# Patient Record
Sex: Male | Born: 1995 | Race: Black or African American | Hispanic: No | Marital: Married | Smoking: Current every day smoker
Health system: Southern US, Community
[De-identification: ages and names within clinical notes are randomized; demographics above are authoritative.]

## PROBLEM LIST (undated history)

## (undated) ENCOUNTER — Emergency Department (HOSPITAL_BASED_OUTPATIENT_CLINIC_OR_DEPARTMENT_OTHER): Admission: EM | Payer: BC Managed Care – PPO | Source: Home / Self Care

## (undated) DIAGNOSIS — F419 Anxiety disorder, unspecified: Secondary | ICD-10-CM

## (undated) DIAGNOSIS — K219 Gastro-esophageal reflux disease without esophagitis: Secondary | ICD-10-CM

## (undated) DIAGNOSIS — F909 Attention-deficit hyperactivity disorder, unspecified type: Secondary | ICD-10-CM

## (undated) DIAGNOSIS — J45909 Unspecified asthma, uncomplicated: Secondary | ICD-10-CM

## (undated) HISTORY — DX: Anxiety disorder, unspecified: F41.9

## (undated) HISTORY — DX: Unspecified asthma, uncomplicated: J45.909

## (undated) HISTORY — DX: Gastro-esophageal reflux disease without esophagitis: K21.9

## (undated) HISTORY — DX: Attention-deficit hyperactivity disorder, unspecified type: F90.9

---
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
## 2013-01-19 ENCOUNTER — Ambulatory Visit: Payer: BC Managed Care – PPO | Admitting: Pediatrics

## 2013-01-31 ENCOUNTER — Encounter: Payer: Self-pay | Admitting: Pediatrics

## 2013-08-25 NOTE — Telephone Encounter (Signed)
done

## 2013-11-07 ENCOUNTER — Encounter: Payer: Self-pay | Admitting: Pediatrics

## 2013-11-07 ENCOUNTER — Ambulatory Visit (INDEPENDENT_AMBULATORY_CARE_PROVIDER_SITE_OTHER): Payer: BC Managed Care – PPO | Admitting: Pediatrics

## 2013-11-07 VITALS — BP 118/64 | Wt 195.5 lb

## 2013-11-07 DIAGNOSIS — S335XXA Sprain of ligaments of lumbar spine, initial encounter: Secondary | ICD-10-CM

## 2013-11-07 DIAGNOSIS — Z23 Encounter for immunization: Secondary | ICD-10-CM

## 2013-11-07 DIAGNOSIS — S39012A Strain of muscle, fascia and tendon of lower back, initial encounter: Secondary | ICD-10-CM

## 2013-11-07 NOTE — Patient Instructions (Addendum)
Please continue to take ibuprofen/Advil as needed for pain.  If you are needing this medicine for more than 2-3 more weeks, or if you start to get stomach pain symptoms, you should start to take an acid blocker medication. These are available over the counter, examples are Pepcid (famotidine) or Zantac (ranitidine).  You have been referred to a Sports Medicine physician. Please follow up with them.  You have a well check scheduled for follow up on 8/27.Back Exercises These exercises may help you when beginning to rehabilitate your injury. Your symptoms may resolve with or without further involvement from your physician, physical therapist or athletic trainer. While completing these exercises, remember:   Restoring tissue flexibility helps normal motion to return to the joints. This allows healthier, less painful movement and activity.  An effective stretch should be held for at least 30 seconds.  A stretch should never be painful. You should only feel a gentle lengthening or release in the stretched tissue. STRETCH - Extension, Prone on Elbows   Lie on your stomach on the floor, a bed will be too soft. Place your palms about shoulder width apart and at the height of your head.  Place your elbows under your shoulders. If this is too painful, stack pillows under your chest.  Allow your body to relax so that your hips drop lower and make contact more completely with the floor.  Hold this position for __________ seconds.  Slowly return to lying flat on the floor. Repeat __________ times. Complete this exercise __________ times per day.  RANGE OF MOTION - Extension, Prone Press Ups   Lie on your stomach on the floor, a bed will be too soft. Place your palms about shoulder width apart and at the height of your head.  Keeping your back as relaxed as possible, slowly straighten your elbows while keeping your hips on the floor. You may adjust the placement of your hands to maximize your comfort.  As you gain motion, your hands will come more underneath your shoulders.  Hold this position __________ seconds.  Slowly return to lying flat on the floor. Repeat __________ times. Complete this exercise __________ times per day.  RANGE OF MOTION- Quadruped, Neutral Spine   Assume a hands and knees position on a firm surface. Keep your hands under your shoulders and your knees under your hips. You may place padding under your knees for comfort.  Drop your head and point your tail bone toward the ground below you. This will round out your low back like an angry cat. Hold this position for __________ seconds.  Slowly lift your head and release your tail bone so that your back sags into a large arch, like an old horse.  Hold this position for __________ seconds.  Repeat this until you feel limber in your low back.  Now, find your "sweet spot." This will be the most comfortable position somewhere between the two previous positions. This is your neutral spine. Once you have found this position, tense your stomach muscles to support your low back.  Hold this position for __________ seconds. Repeat __________ times. Complete this exercise __________ times per day.  STRETCH - Flexion, Single Knee to Chest   Lie on a firm bed or floor with both legs extended in front of you.  Keeping one leg in contact with the floor, bring your opposite knee to your chest. Hold your leg in place by either grabbing behind your thigh or at your knee.  Pull until you  feel a gentle stretch in your low back. Hold __________ seconds.  Slowly release your grasp and repeat the exercise with the opposite side. Repeat __________ times. Complete this exercise __________ times per day.  STRETCH - Hamstrings, Standing  Stand or sit and extend your right / left leg, placing your foot on a chair or foot stool  Keeping a slight arch in your low back and your hips straight forward.  Lead with your chest and lean forward  at the waist until you feel a gentle stretch in the back of your right / left knee or thigh. (When done correctly, this exercise requires leaning only a small distance.)  Hold this position for __________ seconds. Repeat __________ times. Complete this stretch __________ times per day. STRENGTHENING - Deep Abdominals, Pelvic Tilt   Lie on a firm bed or floor. Keeping your legs in front of you, bend your knees so they are both pointed toward the ceiling and your feet are flat on the floor.  Tense your lower abdominal muscles to press your low back into the floor. This motion will rotate your pelvis so that your tail bone is scooping upwards rather than pointing at your feet or into the floor.  With a gentle tension and even breathing, hold this position for __________ seconds. Repeat __________ times. Complete this exercise __________ times per day.  STRENGTHENING - Abdominals, Crunches   Lie on a firm bed or floor. Keeping your legs in front of you, bend your knees so they are both pointed toward the ceiling and your feet are flat on the floor. Cross your arms over your chest.  Slightly tip your chin down without bending your neck.  Tense your abdominals and slowly lift your trunk high enough to just clear your shoulder blades. Lifting higher can put excessive stress on the low back and does not further strengthen your abdominal muscles.  Control your return to the starting position. Repeat __________ times. Complete this exercise __________ times per day.  STRENGTHENING - Quadruped, Opposite UE/LE Lift   Assume a hands and knees position on a firm surface. Keep your hands under your shoulders and your knees under your hips. You may place padding under your knees for comfort.  Find your neutral spine and gently tense your abdominal muscles so that you can maintain this position. Your shoulders and hips should form a rectangle that is parallel with the floor and is not twisted.  Keeping  your trunk steady, lift your right hand no higher than your shoulder and then your left leg no higher than your hip. Make sure you are not holding your breath. Hold this position __________ seconds.  Continuing to keep your abdominal muscles tense and your back steady, slowly return to your starting position. Repeat with the opposite arm and leg. Repeat __________ times. Complete this exercise __________ times per day. Document Released: 04/04/2005 Document Revised: 06/09/2011 Document Reviewed: 06/29/2008 [facility] Patient Information 2015 [place], [place]. This information is not intended to replace advice given to you by your health care provider. Make sure you discuss any questions you have with your health care provider.

## 2013-11-07 NOTE — Progress Notes (Signed)
Subjective:    Torell is a 18 y.o. old male here with his mother for Back Pain .    HPI Comments: The patient works at [employer], and he was loading boxes, when he slipped and hit his back on the metal slide where the boxes slide. He says he hit in the small of his back. This occurred 10/25/13. He was sent home that day. The next day he did not feel better but went back to work and tried to work, but has been sent home 3 days and worked 4 complete days since then. He does have days when he feels somewhat better, but no pain-free days since the injury. He has been taking advil 600mg  in the morning and evening every day since it happened, this helps some. He took a dose of his mother's tizanidine on 3 occasions at night, which made him fall asleep but did not have any lasting benefit on his back pain. He has also used ice, heating pads and icy hot with some improvement.  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 Then he goes to [employer] at night from 5-10pm. Summer camp is ending this week.  He wears a brace at work.   Review of Systems  History and Problem List: Gardner has Overweight; Knee pain, chronic; Loss of weight; Other malaise and fatigue; and Nausea alone on his problem list.  Yash  has a past medical history of ADHD (attention deficit hyperactivity disorder) (4.16.03 ) and Asthma (11.16.10).  Immunizations needed: HPV (given)     Objective:    BP 118/64  Wt 195 lb 8.8 oz (88.7 kg)  Physical Exam  Nursing note and vitals reviewed. Constitutional: He is oriented to person, place, and time. He appears well-nourished. No distress.  HENT:  Head: Normocephalic and atraumatic.  Right Ear: External ear normal.  Left Ear: External ear normal.  Nose: Nose normal.  Eyes: Conjunctivae and EOM are normal. Right eye exhibits no discharge. Left eye exhibits no discharge.  Neck: Normal range of motion.  Cardiovascular: Normal rate and regular rhythm.   Pulmonary/Chest: Effort  normal. No respiratory distress.  Musculoskeletal: He exhibits tenderness.  Diffusely tender over spine and paraspinous area from L3 down to the sacrum. No specific point tenderness. Significant muscular tightness. Straight leg raise negative bilaterally. Pt able to flex fully to touch toes, although slowly due to pain. No visible bruising, no cords or palpable point abnormalities.  Neurological: He is alert and oriented to person, place, and time.  Skin: Skin is warm and dry.  Psychiatric: He has a normal mood and affect. His behavior is normal.       Assessment and Plan:     Myrtle was seen today for Back Pain  His exam is reassuring that there is not an underlying bony injury or disc herniation, specifically he does not have sharp point tenderness over the spine, nor does he have radicular symptoms such as weakness, numbness or tingling in the legs. His injury is a bit concerning in that I would have expected it to have healed more by now. However, he has not been able to rest his back much since the injury given his work schedule. Therefore, I would like him to be seen by sports medicine in order to provide more help with relaxing the involved muscles and resolving the underlying inflammation. Ultimately what he most likely needs is rest, but given his social circumstances and the job at [employer], he is unlikely  to be able to rest fully without jeopardizing his job. I have offered him a doctor's note that could help with his work if he wishes; he will call in the near future if this becomes an issue.  I also recommended that he continue to use ibuprofen for back pain and gave some instructions for stretches/exercises that he can do to loosen his back muscles. I did not start him on any GI prophylaxis today, but recommended that he start an acid blocker if he needs the ibuprofen for more than an additional 2-3 weeks, or if he develops any stomach/abdominal pain.  He is due for a well check and we  have scheduled this with his PCP; thereafter, he plans to change to an adult provider.   Problem List Items Addressed This Visit   None    Visit Diagnoses   Low back strain, initial encounter    -  Primary    Relevant Orders       Ambulatory referral to Pediatric Orthopedics    Need for prophylactic vaccination and inoculation against unspecified single disease        Relevant Orders       HPV vaccine quadravalent 3 dose IM (Completed)       Return in 2 weeks (on 11/24/2013) for well check.  Ansel BongNidel, Kahdijah Errickson, MD

## 2013-11-09 NOTE — Progress Notes (Signed)
I saw and examined the patient with the resident physician in clinic and agree with the above documentation. Aubria Vanecek, MD 

## 2013-11-24 ENCOUNTER — Ambulatory Visit: Payer: Self-pay | Admitting: Pediatrics

## 2015-09-11 ENCOUNTER — Encounter (HOSPITAL_COMMUNITY): Payer: Self-pay

## 2015-09-11 ENCOUNTER — Emergency Department (HOSPITAL_COMMUNITY)
Admission: EM | Admit: 2015-09-11 | Discharge: 2015-09-11 | Disposition: A | Discharge status: Home / Self Care | Payer: BLUE CROSS/BLUE SHIELD | Attending: Emergency Medicine | Admitting: Emergency Medicine

## 2015-09-11 DIAGNOSIS — J45909 Unspecified asthma, uncomplicated: Secondary | ICD-10-CM | POA: Diagnosis not present

## 2015-09-11 DIAGNOSIS — F17210 Nicotine dependence, cigarettes, uncomplicated: Secondary | ICD-10-CM | POA: Diagnosis not present

## 2015-09-11 DIAGNOSIS — F1290 Cannabis use, unspecified, uncomplicated: Secondary | ICD-10-CM | POA: Insufficient documentation

## 2015-09-11 DIAGNOSIS — R55 Syncope and collapse: Secondary | ICD-10-CM

## 2015-09-11 DIAGNOSIS — F909 Attention-deficit hyperactivity disorder, unspecified type: Secondary | ICD-10-CM | POA: Diagnosis not present

## 2015-09-11 LAB — CBC
HEMATOCRIT: 42.6 % (ref 39.0–52.0)
HEMOGLOBIN: 15.3 g/dL (ref 13.0–17.0)
MCH: 34.1 pg — ABNORMAL HIGH (ref 26.0–34.0)
MCHC: 35.9 g/dL (ref 30.0–36.0)
MCV: 94.9 fL (ref 78.0–100.0)
Platelets: 153 10*3/uL (ref 150–400)
RBC: 4.49 MIL/uL (ref 4.22–5.81)
RDW: 12.1 % (ref 11.5–15.5)
WBC: 3.4 10*3/uL — AB (ref 4.0–10.5)

## 2015-09-11 LAB — BASIC METABOLIC PANEL
Anion gap: 7 (ref 5–15)
BUN: 15 mg/dL (ref 6–20)
CHLORIDE: 105 mmol/L (ref 101–111)
CO2: 26 mmol/L (ref 22–32)
CREATININE: 0.89 mg/dL (ref 0.61–1.24)
Calcium: 9.2 mg/dL (ref 8.9–10.3)
GFR calc Af Amer: >60 mL/min (ref >60)
GFR calc non Af Amer: >60 mL/min (ref >60)
GLUCOSE: 81 mg/dL (ref 65–99)
POTASSIUM: 3.9 mmol/L (ref 3.5–5.1)
SODIUM: 138 mmol/L (ref 135–145)

## 2015-09-11 LAB — URINALYSIS, ROUTINE W REFLEX MICROSCOPIC
BILIRUBIN URINE: NEGATIVE
GLUCOSE, UA: NEGATIVE mg/dL
Hgb urine dipstick: NEGATIVE
KETONES UR: 15 mg/dL — AB
LEUKOCYTES UA: NEGATIVE
Nitrite: NEGATIVE
PH: 7 (ref 5.0–8.0)
Protein, ur: NEGATIVE mg/dL
SPECIFIC GRAVITY, URINE: 1.028 (ref 1.005–1.030)

## 2015-09-11 LAB — CBG MONITORING, ED: GLUCOSE-CAPILLARY: 87 mg/dL (ref 65–99)

## 2015-09-11 LAB — TROPONIN I: Troponin I: <0.03 ng/mL (ref <0.031)

## 2015-09-11 MED ORDER — OXYCODONE-ACETAMINOPHEN 5-325 MG PO TABS
1.0000 | ORAL_TABLET | Freq: Once | ORAL | Status: AC
  Administered 2015-09-11: 1 via ORAL
  Filled 2015-09-11: qty 1

## 2015-09-11 MED ORDER — LIDOCAINE 5 % EX PTCH: 1.0000 | MEDICATED_PATCH | CUTANEOUS | Status: DC

## 2015-09-11 MED ORDER — OXYCODONE-ACETAMINOPHEN 5-325 MG PO TABS: 1.0000 | ORAL_TABLET | ORAL | Status: DC | PRN

## 2015-09-11 MED ORDER — NAPROXEN 500 MG PO TABS: 500.0000 mg | ORAL_TABLET | Freq: Two times a day (BID) | ORAL | Status: DC

## 2015-09-11 MED ORDER — SODIUM CHLORIDE 0.9 % IV BOLUS (SEPSIS)
1000.0000 mL | Freq: Once | INTRAVENOUS | Status: AC
  Administered 2015-09-11: 1000 mL via INTRAVENOUS

## 2015-09-11 MED ORDER — IBUPROFEN 200 MG PO TABS
600.0000 mg | ORAL_TABLET | Freq: Once | ORAL | Status: AC
  Administered 2015-09-11: 600 mg via ORAL
  Filled 2015-09-11: qty 3

## 2015-09-11 MED ORDER — OMEPRAZOLE 20 MG PO CPDR
20.0000 mg | DELAYED_RELEASE_CAPSULE | Freq: Every day | ORAL | Status: DC
Start: 2015-09-11 — End: 2021-06-04

## 2015-09-11 NOTE — ED Notes (Signed)
Per EMS, Pt, from place of work, c/o low back and L shoulder pain after an unwitnessed syncopal episode/fall.  Pain score 7/10.  Pt reports that he has been under a lot off stress recently.  Pt reported chronic low back and L shoulder pain to EMS.  Denies headache.  No deformities noted.

## 2015-09-11 NOTE — ED Notes (Signed)
Bed: WA09 Expected date:  Expected time:  Means of arrival:  Comments: 20 y/o M syncope

## 2015-09-11 NOTE — ED Notes (Signed)
Pt reports previous injury to low back and shoulder while "working at [employer]."  Pt denies surgeries and denies taking pain medications.  Pt reports decreased appetite x "a while" d/t "not having time to eat, stress, and not being hungry."

## 2015-09-11 NOTE — Discharge Instructions (Signed)
You have been seen today for a syncopal episode. Your lab tests showed no acute abnormalities. Return to ED should symptoms recur.  1. Follow up with PCP as soon as possible for reevaluation and chronic management. 2. Drink plenty of fluids and get plenty of rest. You should be drinking at least a liter of water an hour to stay hydrated. Be sure to eat regular meals and snacks throughout the day. Get at least 7-8 hours of sleep each day, if at all possible. Ibuprofen or Tylenol for pain or fever. Zofran for nausea.  3. Prilosec: This medication reduces acid in your stomach. It should help you with your nausea and burning that you have after eating. Take this medication every morning regardless of how you feel. Take it 20-30 minutes prior to your first meal.

## 2015-09-11 NOTE — ED Provider Notes (Signed)
CSN: [id number]     Arrival date & time 09/11/15  0930 History   First MD Initiated Contact with Patient 09/11/15 1002     Chief Complaint  Patient presents with  . Loss of Consciousness  . Back Pain     (Consider location/radiation/quality/duration/timing/severity/associated sxs/prior Treatment) HPI   Juan Perkins is a 20 y.o. male, with a history of ADHD and asthma, presenting to the ED with a syncopal episode that occurred just prior to arrival. Patient states he was sitting in a chair, began to stand up, felt lightheaded, and then woke up on the floor. Coworker states she heard the patient fall and immediately came to his side. Patient was immediately conscious and alert. No seizure activity or confusion witnessed. Last food was around 8-10pm. Patient states he has been working 2 jobs, is under a great of stress, and does not eat or drink usually during a 14 hour shift. Patient states that in addition to not eating due to being busy, he also gets nauseous when he eats. Patient complains of right lower back and right shoulder pain, which is chronic, and feels no worse than normal. Patient denies nausea/vomiting, fever/chills, shortness of breath, chest pain, headache, or any other complaints.  Past Medical History  Diagnosis Date  . ADHD (attention deficit hyperactivity disorder) 4.16.03     dx from [facility] (now [facility] records; on Concerta, then Adderall XR to 4.08, then back to Concerta until 7.09  . Asthma 11.16.10    from [facility] records; one note with rx for albuterol and sample of  singulair    History reviewed. No pertinent past surgical history. Family History  Problem Relation Age of Onset  . Diabetes Paternal Grandfather    Social History  Substance Use Topics  . Smoking status: Current Every Day Smoker    Types: Cigarettes  . Smokeless tobacco: None  . Alcohol Use: Yes     Comment: occ    Review of Systems   Constitutional: Negative for fever and chills.  Respiratory: Negative for shortness of breath.   Cardiovascular: Negative for chest pain.  Gastrointestinal: Negative for nausea, vomiting and abdominal pain.  Musculoskeletal: Negative for neck pain.  Skin: Negative for color change and pallor.  Neurological: Positive for syncope. Negative for dizziness, weakness, light-headedness, numbness and headaches.  All other systems reviewed and are negative.     Allergies  Review of patient's allergies indicates no known allergies.  Home Medications   Prior to Admission medications   Medication Sig Start Date End Date Taking? Authorizing Provider  lidocaine (LIDODERM) 5 % Place 1 patch onto the skin daily. Remove & Discard patch within 12 hours or as directed by MD 09/11/15   Hillard DankerShawn C Joy, PA-C  naproxen (NAPROSYN) 500 MG tablet Take 1 tablet (500 mg total) by mouth 2 (two) times daily. 09/11/15   Shawn C Joy, PA-C  omeprazole (PRILOSEC) 20 MG capsule Take 1 capsule (20 mg total) by mouth daily. 09/11/15   Shawn C Joy, PA-C  oxyCODONE-acetaminophen (PERCOCET/ROXICET) 5-325 MG tablet Take 1 tablet by mouth every 4 (four) hours as needed for severe pain. 09/11/15   Shawn C Joy, PA-C   BP 130/83 mmHg  Pulse 61  Temp(Src) 97.9 F (36.6 C) (Oral)  Resp 13  SpO2 100% Physical Exam  Constitutional: He is oriented to person, place, and time. He appears well-developed and well-nourished. No distress.  HENT:  Head: Normocephalic and atraumatic.  Eyes: Conjunctivae and EOM  are normal. Pupils are equal, round, and reactive to light.  Neck: Normal range of motion. Neck supple.  Cardiovascular: Normal rate, regular rhythm, normal heart sounds and intact distal pulses.   Pulmonary/Chest: Effort normal and breath sounds normal. No respiratory distress.  Abdominal: Soft. There is no tenderness. There is no guarding.  Musculoskeletal: He exhibits no edema or tenderness.  Some tenderness to the musculature  of the right lower back. Full ROM in all extremities and spine. No paraspinal tenderness.   Lymphadenopathy:    He has no cervical adenopathy.  Neurological: He is alert and oriented to person, place, and time. He has normal reflexes.  No sensory deficits. Strength 5/5 in all extremities. No gait disturbance. Coordination intact. Cranial nerves III-XII grossly intact. No facial droop.   Skin: Skin is warm and dry. He is not diaphoretic.  Psychiatric: He has a normal mood and affect. His behavior is normal.  Nursing note and vitals reviewed.   ED Course  Procedures (including critical care time) Labs Review Labs Reviewed  CBC - Abnormal; Notable for the following:    WBC 3.4 (*)    MCH 34.1 (*)    All other components within normal limits  URINALYSIS, ROUTINE W REFLEX MICROSCOPIC (NOT AT [facility]) - Abnormal; Notable for the following:    Ketones, ur 15 (*)    All other components within normal limits  BASIC METABOLIC PANEL  TROPONIN I  CBG MONITORING, ED  CBG MONITORING, ED    Imaging Review No results found. I have personally reviewed and evaluated these images and lab results as part of my medical decision-making.   EKG Interpretation   Date/Time:  Tuesday September 11 2015 09:35:57 EDT Ventricular Rate:  56 PR Interval:  204 QRS Duration: 84 QT Interval:  405 QTC Calculation: 391 R Axis:   -26 Text Interpretation:  Sinus rhythm Borderline prolonged PR interval  Probable left ventricular hypertrophy ST elev, probable normal early repol  pattern No old tracing to compare Confirmed by [facility]  MD, ELLIOTT [phone])  on 09/11/2015 9:46:42 AM      Orthostatic VS for the past 24 hrs:  BP- Lying Pulse- Lying BP- Sitting Pulse- Sitting BP- Standing at 0 minutes Pulse- Standing at 0 minutes  09/11/15 1128 149/79 mmHg 51 135/84 mmHg 66 147/78 mmHg 71      MDM   Final diagnoses:  Syncope, unspecified syncope type    Juan Guess presents for evaluation following a  syncopal episode that occurred just prior to arrival.  Upon first attempted patient contact, patient is sitting upright talking on the phone in no apparent distress. Patient is moving his extremities as well as his head and neck. Patient did not get off the phone to allow evaluation and thus was evaluated at a later time. Patient has no neuro or functional deficits. Based on the physical exam findings combined with the patient's history, suspect he is hypovolemic or vasovagal syncope. Patient felt much better following a liter of IV fluids. Patient ambulated without difficulty. No acute abnormalities on the patient's labs. No indication for imaging at this time. Patient also told of some symptoms that would come on after he eats. Patient admits to a poor diet. States that when he eats certain foods, especially those high in fat, he feels nauseous and gets a burning in his chest. Patient advised on proper diet to alleviate these symptoms. Encouraged to drink plenty of fluids throughout the day. Needs to get more sleep and reduce stress.  Home care strategies and return precautions discussed. Patient to follow up with PCP. Patient voiced understanding of these instructions, accepts the plan, and is comfortable with discharge. Given prescriptions for his back pain as well as Prilosec for his GERD-like symptoms.  Filed Vitals:   09/11/15 1100 09/11/15 1126 09/11/15 1130 09/11/15 1300  BP: 147/79 147/79 135/84 132/80  Pulse: 67 69 66 64  Temp:      TempSrc:      Resp: 20 20 19 17   SpO2: 100% 100% 100% 100%      Anselm Pancoast, PA-C 09/11/15 1450  Mancel Bale, MD 09/11/15 1545

## 2016-05-07 ENCOUNTER — Telehealth: Payer: Self-pay | Admitting: Internal Medicine

## 2016-05-07 ENCOUNTER — Encounter: Payer: Self-pay | Admitting: Internal Medicine

## 2016-05-07 NOTE — Telephone Encounter (Signed)
Mother called recently and asked that we see patient fairly urgently because of a six-month history of dry mouth and urinating a lot. He would be a new patient to us. We made appointment for today with fasting lab work. Patient failed to keep the appointment. When I called to check with the patient, the phone number had been disconnected.  Note mailed to patient's mother regarding missed appointment.

## 2016-05-16 ENCOUNTER — Encounter: Payer: Self-pay | Admitting: Internal Medicine

## 2016-07-21 ENCOUNTER — Ambulatory Visit
Admission: RE | Admit: 2016-07-21 | Discharge: 2016-07-21 | Disposition: A | Discharge status: Home / Self Care | Payer: BLUE CROSS/BLUE SHIELD | Source: Ambulatory Visit | Attending: Internal Medicine | Admitting: Internal Medicine

## 2016-07-21 ENCOUNTER — Ambulatory Visit (INDEPENDENT_AMBULATORY_CARE_PROVIDER_SITE_OTHER): Payer: BLUE CROSS/BLUE SHIELD | Admitting: Internal Medicine

## 2016-07-21 ENCOUNTER — Encounter: Payer: Self-pay | Admitting: Internal Medicine

## 2016-07-21 VITALS — BP 120/90 | HR 65 | Temp 97.0°F | Ht 70.0 in | Wt 195.0 lb

## 2016-07-21 DIAGNOSIS — R110 Nausea: Secondary | ICD-10-CM | POA: Diagnosis not present

## 2016-07-21 DIAGNOSIS — Z0000 Encounter for general adult medical examination without abnormal findings: Secondary | ICD-10-CM

## 2016-07-21 DIAGNOSIS — Z8709 Personal history of other diseases of the respiratory system: Secondary | ICD-10-CM | POA: Diagnosis not present

## 2016-07-21 DIAGNOSIS — J3089 Other allergic rhinitis: Secondary | ICD-10-CM

## 2016-07-21 DIAGNOSIS — R05 Cough: Secondary | ICD-10-CM

## 2016-07-21 DIAGNOSIS — Z13220 Encounter for screening for lipoid disorders: Secondary | ICD-10-CM | POA: Diagnosis not present

## 2016-07-21 DIAGNOSIS — Z113 Encounter for screening for infections with a predominantly sexual mode of transmission: Secondary | ICD-10-CM | POA: Diagnosis not present

## 2016-07-21 DIAGNOSIS — M545 Low back pain: Secondary | ICD-10-CM

## 2016-07-21 DIAGNOSIS — G8929 Other chronic pain: Secondary | ICD-10-CM

## 2016-07-21 DIAGNOSIS — R634 Abnormal weight loss: Secondary | ICD-10-CM

## 2016-07-21 DIAGNOSIS — B360 Pityriasis versicolor: Secondary | ICD-10-CM

## 2016-07-21 DIAGNOSIS — R059 Cough, unspecified: Secondary | ICD-10-CM

## 2016-07-21 DIAGNOSIS — R202 Paresthesia of skin: Secondary | ICD-10-CM | POA: Diagnosis not present

## 2016-07-21 LAB — COMPREHENSIVE METABOLIC PANEL
ALK PHOS: 60 U/L (ref 40–115)
ALT: 47 U/L — ABNORMAL HIGH (ref 9–46)
AST: 39 U/L (ref 10–40)
Albumin: 4 g/dL (ref 3.6–5.1)
BILIRUBIN TOTAL: 0.5 mg/dL (ref 0.2–1.2)
BUN: 8 mg/dL (ref 7–25)
CO2: 29 mmol/L (ref 20–31)
CREATININE: 1.04 mg/dL (ref 0.60–1.35)
Calcium: 9.1 mg/dL (ref 8.6–10.3)
Chloride: 106 mmol/L (ref 98–110)
Glucose, Bld: 84 mg/dL (ref 65–99)
Potassium: 3.9 mmol/L (ref 3.5–5.3)
SODIUM: 141 mmol/L (ref 135–146)
TOTAL PROTEIN: 6.8 g/dL (ref 6.1–8.1)

## 2016-07-21 LAB — POCT URINALYSIS DIPSTICK
Bilirubin, UA: NEGATIVE
Blood, UA: NEGATIVE
GLUCOSE UA: NEGATIVE
Ketones, UA: NEGATIVE
Leukocytes, UA: NEGATIVE
NITRITE UA: NEGATIVE
Spec Grav, UA: 1.01 (ref 1.010–1.025)
UROBILINOGEN UA: 0.2 U/dL
pH, UA: 7 (ref 5.0–8.0)

## 2016-07-21 LAB — CBC WITH DIFFERENTIAL/PLATELET
Basophils Absolute: 30 cells/uL (ref 0–200)
Basophils Relative: 1 %
EOS ABS: 150 {cells}/uL (ref 15–500)
EOS PCT: 5 %
HCT: 44.4 % (ref 38.5–50.0)
Hemoglobin: 15.2 g/dL (ref 13.2–17.1)
Lymphocytes Relative: 45 %
Lymphs Abs: 1350 cells/uL (ref 850–3900)
MCH: 34.2 pg — ABNORMAL HIGH (ref 27.0–33.0)
MCHC: 34.2 g/dL (ref 32.0–36.0)
MCV: 100 fL (ref 80.0–100.0)
MONOS PCT: 8 %
MPV: 9.1 fL (ref 7.5–12.5)
Monocytes Absolute: 240 cells/uL (ref 200–950)
Neutro Abs: 1230 cells/uL — ABNORMAL LOW (ref 1500–7800)
Neutrophils Relative %: 41 %
PLATELETS: 144 10*3/uL (ref 140–400)
RBC: 4.44 MIL/uL (ref 4.20–5.80)
RDW: 13.2 % (ref 11.0–15.0)
WBC: 3 10*3/uL — ABNORMAL LOW (ref 3.8–10.8)

## 2016-07-21 LAB — LIPID PANEL
CHOLESTEROL: 133 mg/dL (ref <200)
HDL: 50 mg/dL (ref >40)
LDL Cholesterol: 72 mg/dL (ref <100)
Total CHOL/HDL Ratio: 2.7 Ratio (ref <5.0)
Triglycerides: 55 mg/dL (ref <150)
VLDL: 11 mg/dL (ref <30)

## 2016-07-21 LAB — TSH: TSH: 0.45 m[IU]/L (ref 0.40–4.50)

## 2016-07-21 MED ORDER — KETOCONAZOLE 2 % EX SHAM: 1.0000 "application " | MEDICATED_SHAMPOO | CUTANEOUS | 99 refills | Status: DC

## 2016-07-21 MED ORDER — ALBUTEROL SULFATE HFA 108 (90 BASE) MCG/ACT IN AERS: 2.0000 | INHALATION_SPRAY | Freq: Four times a day (QID) | RESPIRATORY_TRACT | 0 refills | Status: DC | PRN

## 2016-07-21 NOTE — Patient Instructions (Addendum)
Lab work drawn and pending. Try to get MRI of the LS spine for chronic persistent low back pain. Ventolin inhaler refilled. To have chest x-ray with history of weight loss and asthma. Follow-up in 4 weeks.
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
## 2016-07-22 LAB — HEMOGLOBIN A1C
Hgb A1c MFr Bld: 4.9 % (ref <5.7)
MEAN PLASMA GLUCOSE: 94 mg/dL

## 2016-07-22 LAB — GC/CHLAMYDIA PROBE AMP
CT PROBE, AMP APTIMA: NOT DETECTED
GC PROBE AMP APTIMA: NOT DETECTED

## 2016-07-22 LAB — VITAMIN B12: VITAMIN B 12: 364 pg/mL (ref 200–1100)

## 2016-07-22 LAB — H. PYLORI BREATH TEST: H. PYLORI BREATH TEST: NOT DETECTED

## 2016-07-22 LAB — FOLATE: FOLATE: 10.4 ng/mL (ref >5.4)

## 2016-07-25 ENCOUNTER — Other Ambulatory Visit: Payer: Self-pay | Admitting: Internal Medicine

## 2016-07-25 MED ORDER — MELOXICAM 15 MG PO TABS: 15.0000 mg | ORAL_TABLET | Freq: Every day | ORAL | 0 refills | Status: DC

## 2016-07-30 ENCOUNTER — Telehealth: Payer: Self-pay | Admitting: Internal Medicine

## 2016-07-30 NOTE — Telephone Encounter (Signed)
MRI LS Spine without contrast; spoke with [facility] @ [facility]; [phone]; Berkley Harvey #[id number] eff 07/30/16 - 09/27/16.  Will ask Dr. Lenord Fellers or Araceli to put MRI order in EPIC.  Then will call [facility] to schedule MRI.  Will then call and have this scheduled.

## 2016-07-31 ENCOUNTER — Other Ambulatory Visit: Payer: Self-pay | Admitting: Internal Medicine

## 2016-07-31 DIAGNOSIS — M5440 Lumbago with sciatica, unspecified side: Secondary | ICD-10-CM

## 2016-07-31 NOTE — Telephone Encounter (Signed)
MRI scheduled for 08/11/16 @ [facility] at 7:10 a.m.  Patient has been notified.

## 2016-08-11 ENCOUNTER — Other Ambulatory Visit: Payer: BLUE CROSS/BLUE SHIELD

## 2016-08-13 ENCOUNTER — Telehealth: Payer: Self-pay

## 2016-08-13 NOTE — Telephone Encounter (Signed)
Patient called to discuss lab work and ask what STD screenings where done. Let him know that only GC/Chlamyida was done and came back negative. Pt asked about any other tests that can be ran to look for possible STDS I informed him we do not like to do the HSV screening since it is not always conclusive but HIV screening can be done. Pt states he understood the information and had no other questions at this time.

## 2016-08-19 ENCOUNTER — Ambulatory Visit: Payer: BLUE CROSS/BLUE SHIELD | Admitting: Internal Medicine

## 2016-08-27 ENCOUNTER — Other Ambulatory Visit: Payer: Self-pay | Admitting: Internal Medicine

## 2016-10-29 ENCOUNTER — Other Ambulatory Visit: Payer: BLUE CROSS/BLUE SHIELD

## 2016-10-31 ENCOUNTER — Ambulatory Visit: Payer: BLUE CROSS/BLUE SHIELD | Admitting: Internal Medicine

## 2016-11-06 ENCOUNTER — Encounter: Payer: Self-pay | Admitting: Internal Medicine

## 2016-11-06 ENCOUNTER — Ambulatory Visit: Payer: BLUE CROSS/BLUE SHIELD | Admitting: Internal Medicine

## 2016-11-06 ENCOUNTER — Telehealth: Payer: Self-pay | Admitting: Internal Medicine

## 2016-11-06 NOTE — Telephone Encounter (Signed)
Patient had been scheduled previously for MRI of LS-spine and missed that appointment at [facility]. He failed to reschedule within the timeframe permitted by the insurance company- I.e. the order expired. We requested that he come back for another office visit. He missed a recent appointment for that. We called him and spoke with him and explained to him that this order had expired and he would need to be seen here again before we could put in another order. He seemed to understand that. Says his schedule did not permit him to go to the MRI appointment that was made for him. We made an appointment for today to discuss this with him. He did not come for the appointment.  I also spoke with his mother a few days ago about his failure to follow-up. Told her I was frustrated with this. We are not going to reschedule another appointment at this time until we hear from patient

## 2018-05-29 IMAGING — CR DG CHEST 2V
2 series · 2 of 2 positions shown · non-contrast
Comparison: None.

CLINICAL DATA: Persistent cough.  Asthma.

EXAM:
CHEST  2 VIEW

[w chest pa]
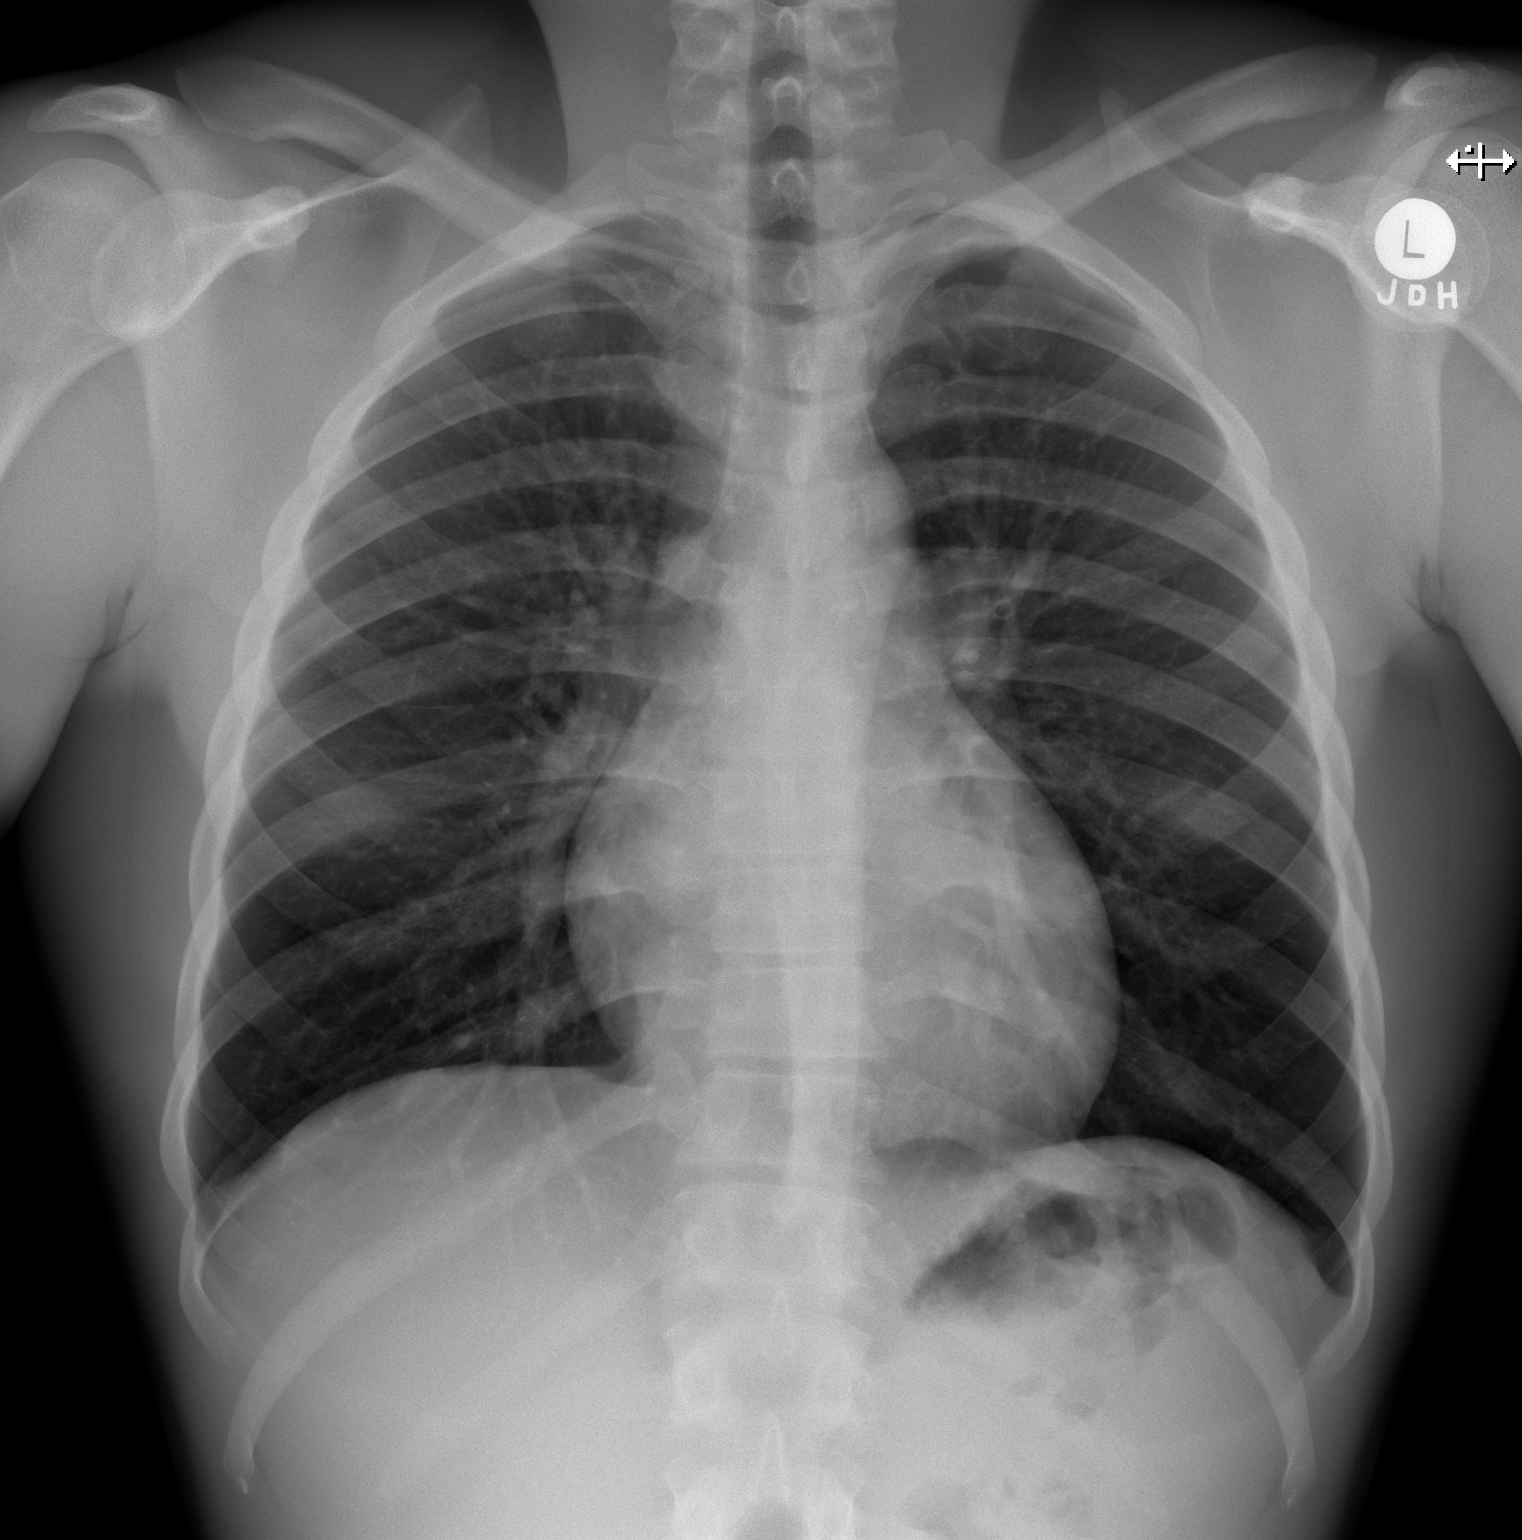

[w chest lat]
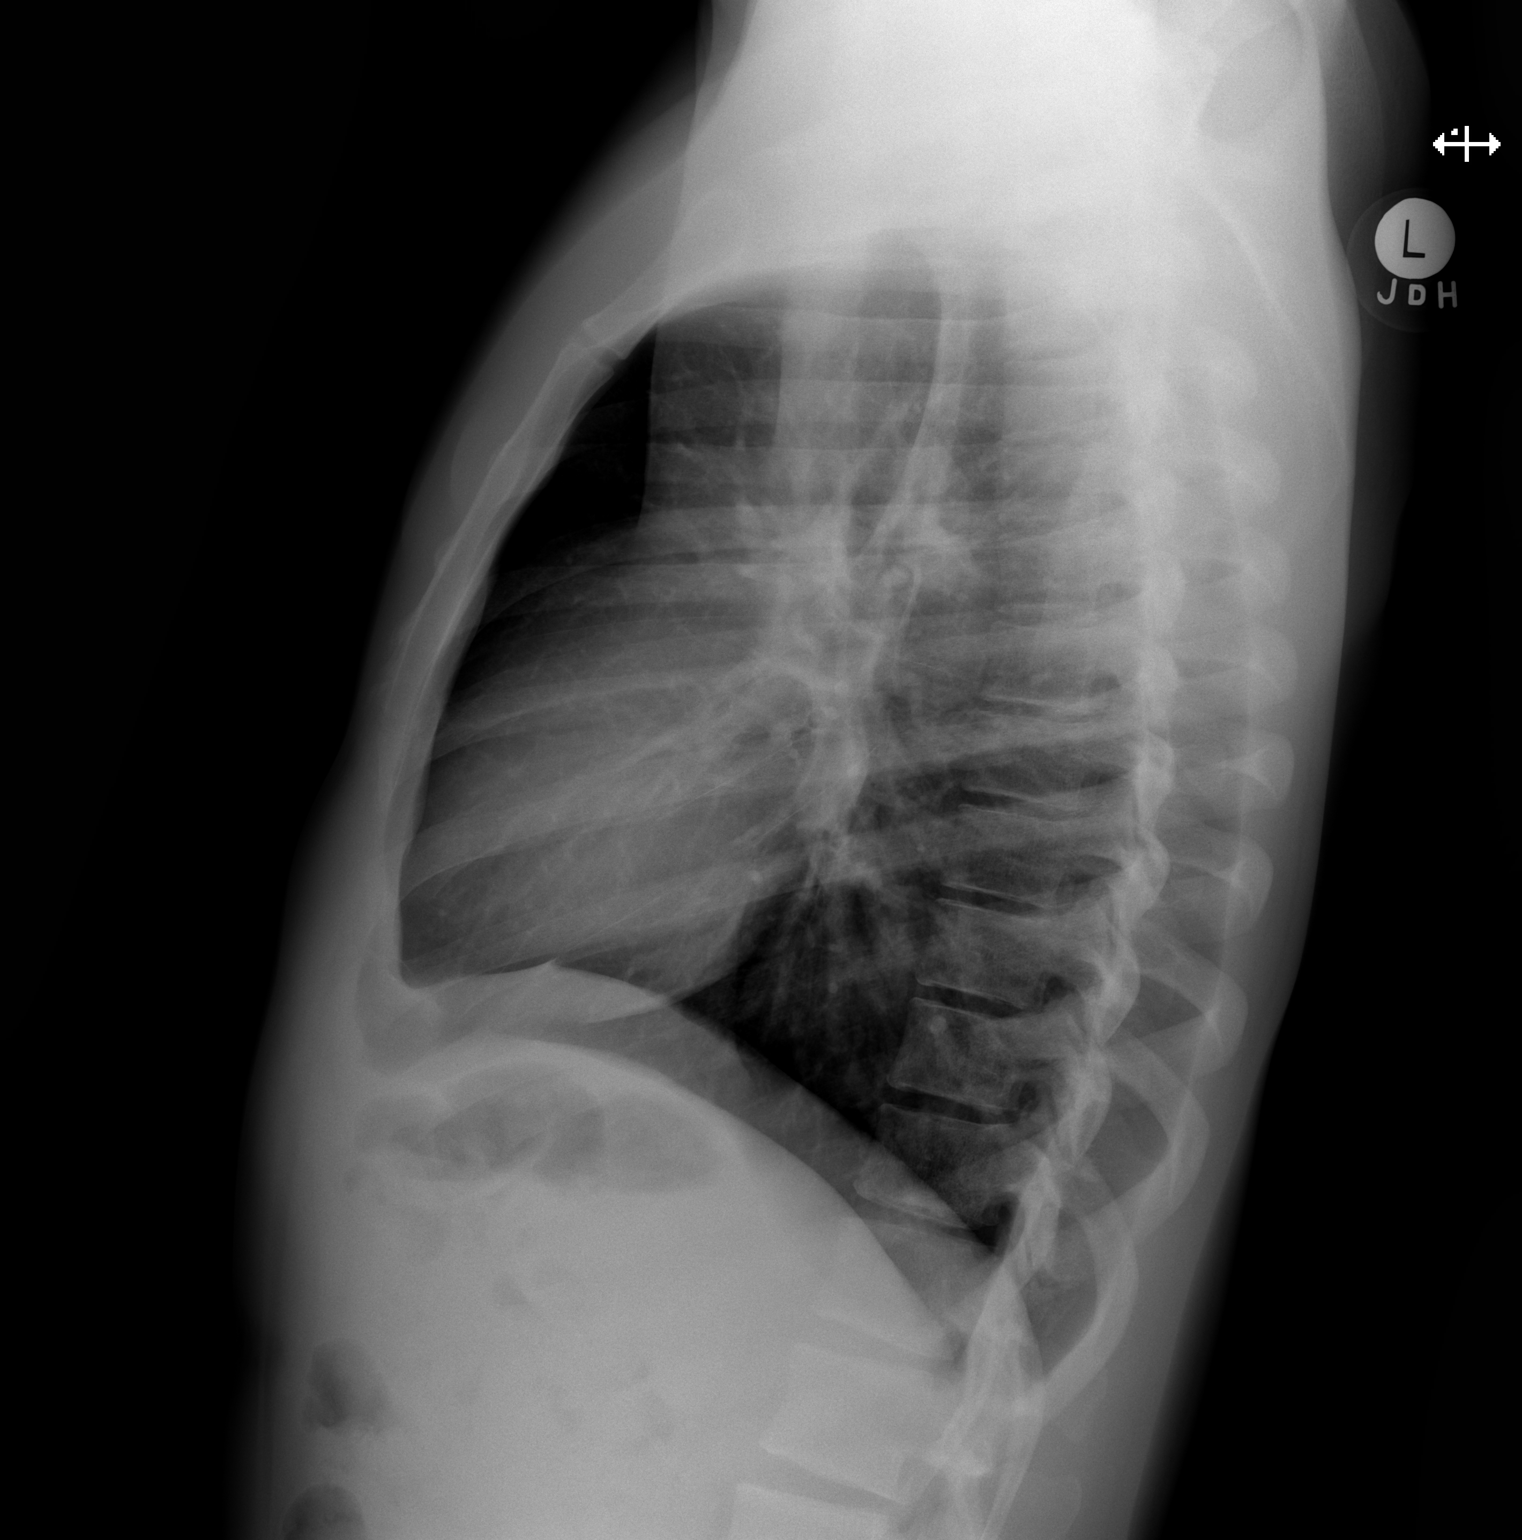

[2 of 2 positions shown; findings below may reference images not displayed]

FINDINGS: The heart size and mediastinal contours are within normal limits.
Both lungs are clear. The visualized skeletal structures are
unremarkable.
IMPRESSION: Negative.  No active cardiopulmonary disease.

## 2021-02-27 ENCOUNTER — Encounter (HOSPITAL_BASED_OUTPATIENT_CLINIC_OR_DEPARTMENT_OTHER): Payer: Self-pay

## 2021-02-27 ENCOUNTER — Other Ambulatory Visit: Payer: Self-pay

## 2021-02-27 ENCOUNTER — Emergency Department (HOSPITAL_BASED_OUTPATIENT_CLINIC_OR_DEPARTMENT_OTHER)
Admission: EM | Admit: 2021-02-27 | Discharge: 2021-02-27 | Disposition: A | Discharge status: Home / Self Care | Payer: BC Managed Care – PPO | Attending: Emergency Medicine | Admitting: Emergency Medicine

## 2021-02-27 DIAGNOSIS — J45909 Unspecified asthma, uncomplicated: Secondary | ICD-10-CM | POA: Diagnosis not present

## 2021-02-27 DIAGNOSIS — B009 Herpesviral infection, unspecified: Secondary | ICD-10-CM | POA: Diagnosis not present

## 2021-02-27 DIAGNOSIS — A6000 Herpesviral infection of urogenital system, unspecified: Secondary | ICD-10-CM

## 2021-02-27 DIAGNOSIS — F17210 Nicotine dependence, cigarettes, uncomplicated: Secondary | ICD-10-CM | POA: Insufficient documentation

## 2021-02-27 DIAGNOSIS — R109 Unspecified abdominal pain: Secondary | ICD-10-CM | POA: Diagnosis not present

## 2021-02-27 MED ORDER — VALACYCLOVIR HCL 1 G PO TABS: 1000.0000 mg | ORAL_TABLET | Freq: Two times a day (BID) | ORAL | 0 refills | Status: DC

## 2021-02-27 MED ORDER — VALACYCLOVIR HCL 1 G PO TABS: 1000.0000 mg | ORAL_TABLET | Freq: Two times a day (BID) | ORAL | 0 refills | Status: AC

## 2021-02-27 NOTE — ED Provider Notes (Signed)
[facility] EMERGENCY DEPARTMENT Provider Note   CSN: [id number] Arrival date & time: 02/27/21  1130     History Chief Complaint  Patient presents with   Abscess    Juan Perkins is a 25 y.o. male.  Patient presents with concern for swelling tenderness in the right groin region.  He states he has had a bump there for about a month and today it opened up.  No reports of fevers.  No cough.  No vomiting or diarrhea.  Past medical history of genital herpes infection.      Past Medical History:  Diagnosis Date   ADHD (attention deficit hyperactivity disorder) 4.16.03    dx from [facility] (now [facility] records; on Concerta, then Adderall XR to 4.08, then back to Concerta until 7.09   Asthma 11.16.10   from [facility] records; one note with rx for albuterol and sample of  singulair     Patient Active Problem List   Diagnosis Date Noted   Loss of weight 12/29/2012   Other malaise and fatigue 12/29/2012   Nausea alone 12/29/2012   Overweight 11/18/2012   Knee pain, chronic 11/18/2012    History reviewed. No pertinent surgical history.     Family History  Problem Relation Age of Onset   Diabetes Paternal Grandfather    Hypertension Paternal Grandfather    Diabetes Father    Hypertension Father    Hypertension Maternal Grandmother     Social History   Tobacco Use   Smoking status: Every Day    Packs/day: 0.30    Types: Cigarettes   Smokeless tobacco: Never  Vaping Use   Vaping Use: Never used  Substance Use Topics   Alcohol use: Yes    Comment: weekly   Drug use: Yes    Types: Marijuana    Home Medications Prior to Admission medications   Medication Sig Start Date End Date Taking? Authorizing Provider  valACYclovir (VALTREX) 1000 MG tablet Take 1 tablet (1,000 mg total) by mouth 2 (two) times daily for 7 days. 02/27/21 03/06/21 Yes Cheryll Cockayne, MD  albuterol (PROVENTIL HFA;VENTOLIN HFA) 108 (90  Base) MCG/ACT inhaler Inhale 2 puffs into the lungs every 6 (six) hours as needed for wheezing or shortness of breath. 07/21/16   Margaree Mackintosh, MD  ketoconazole (NIZORAL) 2 % shampoo Apply 1 application topically 2 (two) times a week. 07/21/16   Margaree Mackintosh, MD  lidocaine (LIDODERM) 5 % Place 1 patch onto the skin daily. Remove & Discard patch within 12 hours or as directed by MD Patient not taking: Reported on 07/21/2016 09/11/15   Joy, Hillard Danker, PA-C  meloxicam (MOBIC) 15 MG tablet TAKE 1 TABLET BY MOUTH EVERY DAY 08/27/16   Margaree Mackintosh, MD  naproxen (NAPROSYN) 500 MG tablet Take 1 tablet (500 mg total) by mouth 2 (two) times daily. Patient not taking: Reported on 07/21/2016 09/11/15   Anselm Pancoast, PA-C  omeprazole (PRILOSEC) 20 MG capsule Take 1 capsule (20 mg total) by mouth daily. Patient not taking: Reported on 07/21/2016 09/11/15   Anselm Pancoast, PA-C  oxyCODONE-acetaminophen (PERCOCET/ROXICET) 5-325 MG tablet Take 1 tablet by mouth every 4 (four) hours as needed for severe pain. Patient not taking: Reported on 07/21/2016 09/11/15   Anselm Pancoast, PA-C    Allergies    Other  Review of Systems   Review of Systems  Constitutional:  Negative for fever.  HENT:  Negative for ear pain and  sore throat.   Eyes:  Negative for pain.  Respiratory:  Negative for cough.   Cardiovascular:  Negative for chest pain.  Gastrointestinal:  Negative for abdominal pain.  Genitourinary:  Negative for flank pain.  Musculoskeletal:  Negative for back pain.  Skin:  Negative for color change and rash.  Neurological:  Negative for syncope.  All other systems reviewed and are negative.  Physical Exam Updated Vital Signs BP (!) 133/96 (BP Location: Right Arm)   Pulse 68   Temp 98.3 F (36.8 C) (Oral)   Resp 20   Ht 5\' 11"  (1.803 m)   Wt 108.9 kg   SpO2 98%   BMI 33.47 kg/m   Physical Exam Constitutional:      Appearance: He is well-developed.  HENT:     Head: Normocephalic.     Nose: Nose normal.   Eyes:     Extraocular Movements: Extraocular movements intact.  Cardiovascular:     Rate and Rhythm: Normal rate.  Pulmonary:     Effort: Pulmonary effort is normal.  Genitourinary:    Comments: Right inguinal lesion is present.  Some shotty lymph nodes in moderate induration with overlying ulcerative lesion. Skin:    Coloration: Skin is not jaundiced.  Neurological:     Mental Status: He is alert. Mental status is at baseline.    ED Results / Procedures / Treatments   Labs (all labs ordered are listed, but only abnormal results are displayed) Labs Reviewed - No data to display  EKG None  Radiology No results found.  Procedures Procedures   Medications Ordered in ED Medications - No data to display  ED Course  I have reviewed the triage vital signs and the nursing notes.  Pertinent labs & imaging results that were available during my care of the patient were reviewed by me and considered in my medical decision making (see chart for details).    MDM Rules/Calculators/A&P                           Given patient's history I did consider an abscess, however on palpation he does have some lymph node swelling in that region of the right groin, however the actual lesion itself is not ulcerative lesion more consistent with herpes lesion.  This appears consistent with his history of herpes lesions in the past.  Incision and drainage considered, however given the nature of the lesion I feel this is more herpetic.  Recommending outpatient follow-up with his doctor within the week.  Advised return if he has fevers worsening symptoms or any additional concerns.  Advise no sexual intercourse until his lesions have healed.   Final Clinical Impression(s) / ED Diagnoses Final diagnoses:  Genital herpes simplex, unspecified site    Rx / DC Orders ED Discharge Orders          Ordered    valACYclovir (VALTREX) 1000 MG tablet  2 times daily        02/27/21 1231              03/01/21, MD 02/27/21 1231

## 2021-02-27 NOTE — ED Triage Notes (Signed)
Pt c/o right groin abscess x 1 month-NAD-steady gait

## 2021-02-27 NOTE — Discharge Instructions (Addendum)
Avoid sexual activity until your lesions have cleared completely.  Call your primary care doctor or specialist as discussed in the next 2-3 days.   Return immediately back to the ER if:  Your symptoms worsen within the next 12-24 hours. You develop new symptoms such as new fevers, persistent vomiting, new pain, shortness of breath, or new weakness or numbness, or if you have any other concerns.

## 2021-03-06 DIAGNOSIS — R1031 Right lower quadrant pain: Secondary | ICD-10-CM | POA: Insufficient documentation

## 2021-04-08 ENCOUNTER — Ambulatory Visit: Payer: BC Managed Care – PPO | Admitting: Family Medicine

## 2021-06-04 ENCOUNTER — Ambulatory Visit (INDEPENDENT_AMBULATORY_CARE_PROVIDER_SITE_OTHER): Payer: BC Managed Care – PPO | Admitting: Registered Nurse

## 2021-06-04 ENCOUNTER — Encounter: Payer: Self-pay | Admitting: Registered Nurse

## 2021-06-04 VITALS — BP 136/68 | HR 60 | Temp 98.0°F | Resp 18 | Ht 71.0 in | Wt 232.2 lb

## 2021-06-04 DIAGNOSIS — M25562 Pain in left knee: Secondary | ICD-10-CM

## 2021-06-04 DIAGNOSIS — Z0000 Encounter for general adult medical examination without abnormal findings: Secondary | ICD-10-CM | POA: Diagnosis not present

## 2021-06-04 DIAGNOSIS — F902 Attention-deficit hyperactivity disorder, combined type: Secondary | ICD-10-CM | POA: Diagnosis not present

## 2021-06-04 DIAGNOSIS — A6000 Herpesviral infection of urogenital system, unspecified: Secondary | ICD-10-CM

## 2021-06-04 DIAGNOSIS — Z13220 Encounter for screening for lipoid disorders: Secondary | ICD-10-CM

## 2021-06-04 DIAGNOSIS — M25561 Pain in right knee: Secondary | ICD-10-CM

## 2021-06-04 DIAGNOSIS — G8929 Other chronic pain: Secondary | ICD-10-CM

## 2021-06-04 DIAGNOSIS — M25559 Pain in unspecified hip: Secondary | ICD-10-CM

## 2021-06-04 MED ORDER — LISDEXAMFETAMINE DIMESYLATE 50 MG PO CAPS: 50.0000 mg | ORAL_CAPSULE | Freq: Every day | ORAL | 0 refills | Status: DC

## 2021-06-04 MED ORDER — METHOCARBAMOL 500 MG PO TABS
500.0000 mg | ORAL_TABLET | Freq: Three times a day (TID) | ORAL | 0 refills | Status: DC | PRN
Start: 2021-06-04 — End: 2021-06-11

## 2021-06-04 MED ORDER — VALACYCLOVIR HCL 500 MG PO TABS: 500.0000 mg | ORAL_TABLET | Freq: Every day | ORAL | 3 refills | Status: DC

## 2021-06-04 MED ORDER — DICLOFENAC SODIUM 75 MG PO TBEC: 75.0000 mg | DELAYED_RELEASE_TABLET | Freq: Two times a day (BID) | ORAL | 0 refills | Status: DC

## 2021-06-04 NOTE — Patient Instructions (Addendum)
Juan Perkins -  ? ?Great to see you ? ?Can use diclofenac (antiinflammatory) and methocarbamol (muscle relaxer) as needed for aches. I have referred to PT ? ?We can restart on vyvanse. I'll send you a message in a few weeks to see how this is going. ? ?I'll let you know how labs look ? ?Inhaler refilled. ? ?Valtrex sent ? ?Thanks, ? ?Rich  ? ? ? ?If you have lab work done today you will be contacted with your lab results within the next 2 weeks.  If you have not heard from [place] then please contact us. The fastest way to get your results is to register for My Chart. ? ? ?IF you received an x-ray today, you will receive an invoice from [facility]. Please contact [facility] at [phone] with questions or concerns regarding your invoice.  ? ?IF you received labwork today, you will receive an invoice from [place]. Please contact [facility] at [phone] with questions or concerns regarding your invoice.  ? ?Our billing staff will not be able to assist you with questions regarding bills from these companies. ? ?You will be contacted with the lab results as soon as they are available. The fastest way to get your results is to activate your My Chart account. Instructions are located on the last page of this paperwork. If you have not heard from [place] regarding the results in 2 weeks, please contact this office. ?  ? ? ?

## 2021-06-04 NOTE — Progress Notes (Signed)
New Patient Office Visit  Subjective:  Patient ID: Juan Perkins AGE, male    DOB: 1995-08-12  Age: 26 y.o. MRN: [id number]  CC:  Chief Complaint  Patient presents with   New Patient (Initial Visit)    Patient states he is here to establish care and CPE.    HPI [name] presents for visit to est care.  Notes acute concern for groin lesion Hx of genital herpes.  Notes flaring more lately.  Has been stressed out.   Notes joint aches and pains Stiffness through knees and hips.  Worked in [employer] for some time. Does not think he has arthritis but admits he does not exercise and stretch regularly. 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   Notes swelling in hands and feet.   Histories reviewed and updated with patient.   Past Medical History:  Diagnosis Date   ADHD (attention deficit hyperactivity disorder) 07/14/2001   dx from [facility] (now [facility]) records; on Concerta, then Adderall XR to 4.08, then back to Concerta until 7.09   Anxiety    Asthma 02/13/2009   from [facility] records; one note with rx for albuterol and sample of  singulair    GERD (gastroesophageal reflux disease)     History reviewed. No pertinent surgical history.  Family History  Problem Relation Age of Onset   Diabetes Paternal Grandfather    Hypertension Paternal Grandfather    Diabetes Father    Hypertension Father    Hypertension Maternal Grandmother     Social History   Socioeconomic History   Marital status: Media planner    Spouse name: Not on file   Number of children: 0   Years of education: Not on file   Highest education level: Not on file  Occupational History   Occupation: [employer]  Tobacco Use   Smoking status: Every Day     Packs/day: 0.50    Years: 5.00    Pack years: 2.50    Types: Cigarettes   Smokeless tobacco: Never  Vaping Use   Vaping Use: Never used  Substance and Sexual Activity   Alcohol use: Yes    Alcohol/week: 14.0 standard drinks    Types: 4 Cans of beer, 10 Shots of liquor per week    Comment: weekly   Drug use: Yes    Frequency: 5.0 times per week    Types: Marijuana   Sexual activity: Yes    Birth control/protection: None  Other Topics Concern   Not on file  Social History Narrative   Not on file   Social Determinants of Health   Financial Resource Strain: Not on file  Food Insecurity: Not on file  Transportation Needs: Not on file  Physical Activity: Not on file  Stress: Not on file  Social Connections: Not on file  Intimate Partner Violence: Not on file    ROS Review of Systems  Constitutional: Negative.   HENT: Negative.    Eyes: Negative.   Respiratory: Negative.    Cardiovascular: Negative.   Gastrointestinal: Negative.   Genitourinary: Negative.   Musculoskeletal: Negative.   Skin: Negative.   Neurological: Negative.   Psychiatric/Behavioral: Negative.    All other systems reviewed and are negative.  Objective:   Today's Vitals: BP 136/68  Pulse 60    Temp 98 F (36.7 C) (Temporal)    Resp 18    Ht 5\' 11"  (1.803 m)    Wt 232 lb 3.2 oz (105.3 kg)    SpO2 97%    BMI 32.39 kg/m   Physical Exam Vitals and nursing note reviewed.  Constitutional:      General: He is not in acute distress.    Appearance: Normal appearance. He is obese. He is not ill-appearing, toxic-appearing or diaphoretic.  HENT:     Head: Normocephalic and atraumatic.     Right Ear: Tympanic membrane, ear canal and external ear normal. There is no impacted cerumen.     Left Ear: Tympanic membrane, ear canal and external ear normal. There is no impacted cerumen.     Nose: Nose normal. No congestion or rhinorrhea.     Mouth/Throat:     Mouth: Mucous membranes are moist.     Pharynx:  Oropharynx is clear. No oropharyngeal exudate or posterior oropharyngeal erythema.  Eyes:     General: No scleral icterus.       Right eye: No discharge.        Left eye: No discharge.     Extraocular Movements: Extraocular movements intact.     Conjunctiva/sclera: Conjunctivae normal.     Pupils: Pupils are equal, round, and reactive to light.  Neck:     Vascular: No carotid bruit.  Cardiovascular:     Rate and Rhythm: Normal rate and regular rhythm.     Pulses: Normal pulses.     Heart sounds: Normal heart sounds. No murmur heard.   No friction rub. No gallop.  Pulmonary:     Effort: Pulmonary effort is normal. No respiratory distress.     Breath sounds: Normal breath sounds. No stridor. No wheezing, rhonchi or rales.  Chest:     Chest wall: No tenderness.  Abdominal:     General: Abdomen is flat. Bowel sounds are normal. There is no distension.     Palpations: Abdomen is soft. There is no mass.     Tenderness: There is no abdominal tenderness. There is no right CVA tenderness, left CVA tenderness, guarding or rebound.     Hernia: No hernia is present.  Musculoskeletal:        General: No swelling, tenderness, deformity or signs of injury. Normal range of motion.     Cervical back: Normal range of motion and neck supple. No rigidity or tenderness.     Right lower leg: No edema.     Left lower leg: No edema.  Lymphadenopathy:     Cervical: No cervical adenopathy.  Skin:    General: Skin is warm and dry.     Capillary Refill: Capillary refill takes less than 2 seconds.     Coloration: Skin is not jaundiced or pale.     Findings: No bruising, erythema, lesion or rash.  Neurological:     General: No focal deficit present.     Mental Status: He is alert and oriented to person, place, and time. Mental status is at baseline.     Cranial Nerves: No cranial nerve deficit.     Sensory: No sensory deficit.     Motor: No weakness.     Coordination: Coordination normal.     Gait:  Gait normal.     Deep Tendon Reflexes: Reflexes normal.  Psychiatric:        Mood and Affect: Mood normal.        Behavior:  Behavior normal.        Thought Content: Thought content normal.        Judgment: Judgment normal.    Assessment & Plan:   Problem List Items Addressed This Visit       Other   Knee pain, chronic   Relevant Medications   diclofenac (VOLTAREN) 75 MG EC tablet   methocarbamol (ROBAXIN) 500 MG tablet   Other Relevant Orders   Ambulatory referral to Physical Therapy   Other Visit Diagnoses     Annual physical exam    -  Primary   Genital herpes simplex, unspecified site       Relevant Medications   valACYclovir (VALTREX) 500 MG tablet   Other Relevant Orders   CBC with Differential/Platelet   Comprehensive metabolic panel   Hemoglobin A1c   TSH   Attention deficit hyperactivity disorder (ADHD), combined type       Relevant Medications   lisdexamfetamine (VYVANSE) 50 MG capsule   Lipid screening       Relevant Orders   Lipid panel   Hip pain           Outpatient Encounter Medications as of 06/04/2021  Medication Sig   diclofenac (VOLTAREN) 75 MG EC tablet Take 1 tablet (75 mg total) by mouth 2 (two) times daily.   lisdexamfetamine (VYVANSE) 50 MG capsule Take 1 capsule (50 mg total) by mouth daily.   methocarbamol (ROBAXIN) 500 MG tablet Take 1 tablet (500 mg total) by mouth every 8 (eight) hours as needed for muscle spasms.   valACYclovir (VALTREX) 500 MG tablet Take 1 tablet (500 mg total) by mouth daily.   [DISCONTINUED] albuterol (PROVENTIL HFA;VENTOLIN HFA) 108 (90 Base) MCG/ACT inhaler Inhale 2 puffs into the lungs every 6 (six) hours as needed for wheezing or shortness of breath. (Patient not taking: Reported on 06/04/2021)   [DISCONTINUED] ketoconazole (NIZORAL) 2 % shampoo Apply 1 application topically 2 (two) times a week. (Patient not taking: Reported on 06/04/2021)   [DISCONTINUED] lidocaine (LIDODERM) 5 % Place 1 patch onto the skin daily.  Remove & Discard patch within 12 hours or as directed by MD (Patient not taking: Reported on 07/21/2016)   [DISCONTINUED] meloxicam (MOBIC) 15 MG tablet TAKE 1 TABLET BY MOUTH EVERY DAY (Patient not taking: Reported on 06/04/2021)   [DISCONTINUED] naproxen (NAPROSYN) 500 MG tablet Take 1 tablet (500 mg total) by mouth 2 (two) times daily. (Patient not taking: Reported on 07/21/2016)   [DISCONTINUED] omeprazole (PRILOSEC) 20 MG capsule Take 1 capsule (20 mg total) by mouth daily. (Patient not taking: Reported on 07/21/2016)   [DISCONTINUED] oxyCODONE-acetaminophen (PERCOCET/ROXICET) 5-325 MG tablet Take 1 tablet by mouth every 4 (four) hours as needed for severe pain. (Patient not taking: Reported on 07/21/2016)   No facility-administered encounter medications on file as of 06/04/2021.    Follow-up: Return in about 1 year (around 06/05/2022), or if symptoms worsen or fail to improve, for CPE and labs.   PLAN Start vyvanse 50mg  po qd. Med check in 4 weeks. Adjust if warranted. Sending valacyclovir 500mg  po qd for hsv. Can increase to 1000mg  po bid during outbreak. Diclofenac and methocarbamol for aches. Refer to PT for stretching and exercise program designed for his needs.  Otherwise exam unremarkable.  Labs collected. Will follow up with the patient as warranted. Patient encouraged to call clinic with any questions, comments, or concerns.  , NP

## 2021-06-05 LAB — CBC WITH DIFFERENTIAL/PLATELET
Basophils Absolute: 0 10*3/uL (ref 0.0–0.1)
Basophils Relative: 0.4 % (ref 0.0–3.0)
Eosinophils Absolute: 0.1 10*3/uL (ref 0.0–0.7)
Eosinophils Relative: 1.7 % (ref 0.0–5.0)
HCT: 43.4 % (ref 39.0–52.0)
Hemoglobin: 15.1 g/dL (ref 13.0–17.0)
Lymphocytes Relative: 40.9 % (ref 12.0–46.0)
Lymphs Abs: 1.8 10*3/uL (ref 0.7–4.0)
MCHC: 34.9 g/dL (ref 30.0–36.0)
MCV: 101.8 fl — ABNORMAL HIGH (ref 78.0–100.0)
Monocytes Absolute: 0.3 10*3/uL (ref 0.1–1.0)
Monocytes Relative: 7.6 % (ref 3.0–12.0)
Neutro Abs: 2.2 10*3/uL (ref 1.4–7.7)
Neutrophils Relative %: 49.4 % (ref 43.0–77.0)
Platelets: 163 10*3/uL (ref 150.0–400.0)
RBC: 4.27 Mil/uL (ref 4.22–5.81)
RDW: 12.8 % (ref 11.5–15.5)
WBC: 4.4 10*3/uL (ref 4.0–10.5)

## 2021-06-05 LAB — COMPREHENSIVE METABOLIC PANEL
ALT: 67 U/L — ABNORMAL HIGH (ref 0–53)
AST: 27 U/L (ref 0–37)
Albumin: 4.5 g/dL (ref 3.5–5.2)
Alkaline Phosphatase: 68 U/L (ref 39–117)
BUN: 13 mg/dL (ref 6–23)
CO2: 27 mEq/L (ref 19–32)
Calcium: 9.5 mg/dL (ref 8.4–10.5)
Chloride: 105 mEq/L (ref 96–112)
Creatinine, Ser: 1.03 mg/dL (ref 0.40–1.50)
GFR: 99.18 mL/min (ref >60.00)
Glucose, Bld: 121 mg/dL — ABNORMAL HIGH (ref 70–99)
Potassium: 4 mEq/L (ref 3.5–5.1)
Sodium: 141 mEq/L (ref 135–145)
Total Bilirubin: 0.5 mg/dL (ref 0.2–1.2)
Total Protein: 6.9 g/dL (ref 6.0–8.3)

## 2021-06-05 LAB — LIPID PANEL
Cholesterol: 157 mg/dL (ref 0–200)
HDL: 41.6 mg/dL (ref >39.00)
LDL Cholesterol: 84 mg/dL (ref 0–99)
NonHDL: 115.21
Total CHOL/HDL Ratio: 4
Triglycerides: 158 mg/dL — ABNORMAL HIGH (ref 0.0–149.0)
VLDL: 31.6 mg/dL (ref 0.0–40.0)

## 2021-06-05 LAB — HEMOGLOBIN A1C: Hgb A1c MFr Bld: 5.7 % (ref 4.6–6.5)

## 2021-06-05 LAB — TSH: TSH: 0.43 u[IU]/mL (ref 0.35–5.50)

## 2021-06-11 ENCOUNTER — Other Ambulatory Visit: Payer: Self-pay

## 2021-06-11 ENCOUNTER — Other Ambulatory Visit (HOSPITAL_COMMUNITY): Payer: Self-pay

## 2021-06-11 ENCOUNTER — Encounter: Payer: Self-pay | Admitting: Registered Nurse

## 2021-06-11 DIAGNOSIS — G8929 Other chronic pain: Secondary | ICD-10-CM

## 2021-06-11 DIAGNOSIS — F902 Attention-deficit hyperactivity disorder, combined type: Secondary | ICD-10-CM

## 2021-06-11 DIAGNOSIS — A6000 Herpesviral infection of urogenital system, unspecified: Secondary | ICD-10-CM

## 2021-06-11 MED ORDER — DICLOFENAC SODIUM 75 MG PO TBEC
75.0000 mg | DELAYED_RELEASE_TABLET | Freq: Two times a day (BID) | ORAL | 0 refills | Status: DC
  Filled 2021-06-11 – 2021-07-09 (×4): qty 30, 15d supply, fill #0

## 2021-06-11 MED ORDER — METHOCARBAMOL 500 MG PO TABS
500.0000 mg | ORAL_TABLET | Freq: Three times a day (TID) | ORAL | 0 refills | Status: AC | PRN
  Filled 2021-06-11 – 2021-07-09 (×5): qty 60, 20d supply, fill #0

## 2021-06-11 MED ORDER — LISDEXAMFETAMINE DIMESYLATE 50 MG PO CAPS
50.0000 mg | ORAL_CAPSULE | Freq: Every day | ORAL | 0 refills | Status: DC
  Filled 2021-06-11 – 2021-07-09 (×5): qty 30, 30d supply, fill #0

## 2021-06-11 MED ORDER — VALACYCLOVIR HCL 500 MG PO TABS
500.0000 mg | ORAL_TABLET | Freq: Every day | ORAL | 3 refills | Status: AC
  Filled 2021-06-11: qty 90, 90d supply, fill #0
  Filled 2021-06-15 – 2021-07-09 (×4): qty 30, 30d supply, fill #0

## 2021-06-11 NOTE — Telephone Encounter (Signed)
Patient refill request has been sent to PCP. ?

## 2021-06-11 NOTE — Telephone Encounter (Signed)
Patient is requesting a refill of the following medications: ?Requested Prescriptions  ? ?Pending Prescriptions Disp Refills  ? diclofenac (VOLTAREN) 75 MG EC tablet 30 tablet 0  ?  Sig: Take 1 tablet (75 mg total) by mouth 2 (two) times daily.  ? lisdexamfetamine (VYVANSE) 50 MG capsule 30 capsule 0  ?  Sig: Take 1 capsule (50 mg total) by mouth daily.  ? methocarbamol (ROBAXIN) 500 MG tablet 60 tablet 0  ?  Sig: Take 1 tablet (500 mg total) by mouth every 8 (eight) hours as needed for muscle spasms.  ? valACYclovir (VALTREX) 500 MG tablet 90 tablet 3  ?  Sig: Take 1 tablet (500 mg total) by mouth daily.  ? ?Patient needs all medications sent to a different pharmacy I have corrected pharmacy to [facility] long outpatient ?

## 2021-06-15 ENCOUNTER — Other Ambulatory Visit (HOSPITAL_COMMUNITY): Payer: Self-pay

## 2021-06-18 ENCOUNTER — Encounter: Payer: Self-pay | Admitting: Physical Therapy

## 2021-06-18 ENCOUNTER — Ambulatory Visit (INDEPENDENT_AMBULATORY_CARE_PROVIDER_SITE_OTHER): Payer: BC Managed Care – PPO | Admitting: Physical Therapy

## 2021-06-18 DIAGNOSIS — G8929 Other chronic pain: Secondary | ICD-10-CM | POA: Diagnosis not present

## 2021-06-18 DIAGNOSIS — M6281 Muscle weakness (generalized): Secondary | ICD-10-CM | POA: Diagnosis not present

## 2021-06-18 DIAGNOSIS — M25562 Pain in left knee: Secondary | ICD-10-CM | POA: Diagnosis not present

## 2021-06-18 DIAGNOSIS — M25561 Pain in right knee: Secondary | ICD-10-CM | POA: Diagnosis not present

## 2021-06-18 NOTE — Therapy (Signed)
?OUTPATIENT PHYSICAL THERAPY LOWER EXTREMITY EVALUATION ? ? ?Patient Name: Juan Perkins ?MRN: [id number] ?DOB:07-19-1995, 26 y.o., male ?Today's Date: 06/18/2021 ? ? PT End of Session - 06/18/21 1215   ? ? Visit Number 1   ? Number of Visits 12   ? Date for PT Re-Evaluation 07/30/21   ? Authorization Type [employer] 30 VL   ? Authorization - Visit Number 1   ? Authorization - Number of Visits 30   ? Progress Note Due on Visit 10   ? PT Start Time 1217   ? PT Stop Time 1256   ? PT Time Calculation (min) 39 min   ? ?  ?  ? ?  ? ? ?Past Medical History:  ?Diagnosis Date  ? ADHD (attention deficit hyperactivity disorder) 07/14/2001  ? dx from [facility] (now [facility] records; on Concerta, then Adderall XR to 4.08, then back to Concerta until 7.09  ? Anxiety   ? Asthma 02/13/2009  ? from [facility] records; one note with rx for albuterol and sample of  singulair   ? GERD (gastroesophageal reflux disease)   ? ?History reviewed. No pertinent surgical history. ?Patient Active Problem List  ? Diagnosis Date Noted  ? Loss of weight 12/29/2012  ? Other malaise and fatigue 12/29/2012  ? Nausea alone 12/29/2012  ? Overweight 11/18/2012  ? Knee pain, chronic 11/18/2012  ? ? ?PCP: Janeece AgeeMorrow, Richard, NP ? ?REFERRING PROVIDER: Janeece AgeeMorrow, Richard, NP ? ?REFERRING DIAG: M25.561,M25.562,G89.29 (ICD-10-CM) - Chronic pain of both knees ? ?THERAPY DIAG:  ?Chronic pain of left knee ? ?Chronic pain of right knee ? ?Muscle weakness (generalized) ? ?ONSET DATE: 10 + years ? ?SUBJECTIVE:  ? ?SUBJECTIVE STATEMENT: ?States that he has had long lasting knee and back pain. States it is mainly his left knee. States he has had pain since highschool, he used to wrestle in highschool and he tweaked his left knee. States that his knee flare ups with weather changes. States he doesn't know if the back pain is related to his knee pain. Back pain has been since high school. States he had PT previously and they did  electrical therapy. Reports he has a popping with his knee that then causes pain. Reports that massage doesn't feel good as it is so sensitive ? ?PERTINENT HISTORY: ?ADHD, left shoulder dislocation (highschool - no intervention. ? ?PAIN:  ?Are you having pain? Yes: NPRS scale: 5/10 ?Pain location: lower back ?Pain description: dull ?Aggravating factors: bending, lifting, standing,  ?Relieving factors: rest  , tiger balm, icy hot  ? ?No current knee pain. Pain is usually is a deep soreness, doesn't feel muscular ? ?PRECAUTIONS: None ? ?WEIGHT BEARING RESTRICTIONS No ? ?FALLS:  ?Has patient fallen in last 6 months? no, Number of falls: 0 ? ? ?OCCUPATION: [employer] ? ?PLOF: Independent, paint balling (wears a knee brace), juditzu  ? ?PATIENT GOALS to get back to higher function ? ? ?OBJECTIVE:  ? ?DIAGNOSTIC FINDINGS: none at this time ? ?Gait: antalgic, limps, stiff left knee ? ?COGNITION: ? Overall cognitive status: Within functional limits for tasks assessed   ?  ?SENSATION: ?WFL ? ? ?PALPATION: ?increased resting tone throughout left LE, lumbar paraspinals and left glutes. Pain and guarding with palpation in these areas. Mostly joint line pain at left knee, limited patella mobility left but guarding noted. ? ? ? Lumbar AROM:  ?  06/18/2021     ? Flexion  50% limited (painful coming up)    ?  Extension  50% limited (painful)    ? R ROT  0% limited    ? L ROT  0% limtied    ? R SB      ?L SB    ?  * Pain   ?(Blank rows = not tested) ? ? ? LE Measurements ?Lower Extremity Right ?06/18/2021 Left ?06/18/2021  ? A/PROM MMT A/PROM MMT  ?Hip Flexion  4-  3+  ?Hip Extension  4+  4-  ?Hip Abduction      ?Hip Adduction      ?Hip Internal rotation      ?Hip External rotation      ?Knee Flexion 125 4+ 125* 4-  ?Knee Extension 0 4+ 2/0* 4-  ?Ankle Dorsiflexion  5  5  ?Ankle Plantarflexion  18 heel raises *  20 SL heel raises   ?Ankle Inversion      ?Ankle Eversion      ? (Blank rows = not tested) ? * pain ? ? SPECIAL  TESTS:  ?Negative slump test bilaterally ? ? ? ?TODAY'S TREATMENT: ?06/18/2021 ?Therapeutic Exercise: ? Aerobic: ?Supine: ?Prone: press ups x5 30" holds, knee flexion x10, quad stretch x1 30" holds left ? Seated: ? Standing: ?Neuromuscular Re-education: ?Manual Therapy: ?Therapeutic Activity: ?Self Care: ?Trigger Point Dry Needling:  ?Modalities:  ? ? ? ?PATIENT EDUCATION:  ?Education details: on current presentation, on HEP, on muscle guarding and POC ?Person educated: Patient ?Education method: Explanation, Demonstration, and Handouts ?Education comprehension: verbalized understanding ? ? ? ?HOME EXERCISE PROGRAM: ?[id number] ? ?ASSESSMENT: ? ?CLINICAL IMPRESSION: ?Patient is a 26 y.o. male who was seen today for physical therapy evaluation and treatment for left knee pain and back pain. Patient presents with increased muscle guarding in lumbar spine and left LE that limited assessment on this date. Positive response to prone press ups but previous history of shoulder dislocation that was bothered by this exercise. Educated patient in how to perform this exercise with reduced pressure on his shoulder. Patient would greatly benefit from skilled PT to improve overall function and quality of life.  ? ? ?OBJECTIVE IMPAIRMENTS Abnormal gait, decreased activity tolerance, decreased balance, decreased endurance, decreased mobility, difficulty walking, decreased ROM, decreased strength, hypomobility, and pain.  ? ?ACTIVITY LIMITATIONS cleaning, community activity, occupation, yard work, and working out .  ? ?PERSONAL FACTORS Fitness, Past/current experiences, and 1 comorbidity: history of shoulder dislocation  are also affecting patient's functional outcome.  ? ? ?REHAB POTENTIAL: Excellent ? ?CLINICAL DECISION MAKING: Stable/uncomplicated ? ?EVALUATION COMPLEXITY: Low ? ? ?GOALS: ?Goals reviewed with patient?  yes ? ?SHORT TERM GOALS: ? ?Patient will be independent in self management strategies to improve quality of life  and functional outcomes. ?Baseline: new program ?Target date: 07/09/2021 ?Goal status: INITIAL ? ?2.  Patient will report at least 50% improvement in overall symptoms and/or function to demonstrate improved functional mobility ?Baseline: 0% ?Target date: 07/09/2021 ?Goal status: INITIAL ? ?3.  Patient will be able to lay prone without pain to improve ability to sleep at night. ?Baseline: pain with prone lying ?Target date: 07/09/2021 ?Goal status: INITIAL ? ?4.  Patient will be able to demonstrate pain free left knee ROM ?Baseline: painful ?Target date: 07/09/2021 ?Goal status: INITIAL ? ? ? ? ?LONG TERM GOALS: ? ?Patient will report at least 75% improvement in overall symptoms and/or function to demonstrate improved functional mobility ?Baseline: 0% ?Target date: 07/30/2021 ?Goal status: INITIAL ? ?2.  Patient will be able to demonstrate painfree lumbar ROM to improve gross lumbar  mobility ?Baseline: painful ?Target date: 07/30/2021 ?Goal status: INITIAL ? ?3.  Patient will be able to demonstrate at least 4/5 MMT in LE  ?Baseline: see above ?Target date: 07/30/2021 ?Goal status: INITIAL ? ? ? ?PLAN: ?PT FREQUENCY: 2x/week ? ?PT DURATION: 6 weeks ? ?PLANNED INTERVENTIONS: Therapeutic exercises, Therapeutic activity, Neuromuscular re-education, Balance training, Gait training, Patient/Family education, Joint manipulation, Joint mobilization, Stair training, Orthotic/Fit training, Dry Needling, Electrical stimulation, Spinal mobilization, Cryotherapy, Moist heat, and Manual therapy ? ?PLAN FOR NEXT SESSION: assess hip mobility, isometrics, self mobilization techniques, lumbar ROM ? ?1:51 PM, 06/18/21 ?Tereasa Coop, DPT ?Physical Therapy with [place] ?[facility]  ?[phone] office ? ?

## 2021-06-18 NOTE — Therapy (Signed)
?OUTPATIENT PHYSICAL THERAPY TREATMENT NOTE ? ? ?Patient Name: Juan Perkins ?MRN: [id number] ?DOB:12-20-1995, 26 y.o., male ?Today's Date: 06/25/2021 ? ?PCP: Janeece Agee, NP ?REFERRING PROVIDER: Janeece Agee, NP ? ? PT End of Session - 06/25/21 1216   ? ? Visit Number 2   ? Number of Visits 12   ? Date for PT Re-Evaluation 07/30/21   ? Authorization Type BCBS 30 VL   ? Authorization - Visit Number 2   ? Authorization - Number of Visits 30   ? Progress Note Due on Visit 10   ? PT Start Time 1217   ? PT Stop Time 1255   ? PT Time Calculation (min) 38 min   ? ?  ?  ? ?  ? ? ?Past Medical History:  ?Diagnosis Date  ? ADHD (attention deficit hyperactivity disorder) 07/14/2001  ? dx from [facility] records; on Concerta, then Adderall XR to 4.08, then back to Concerta until 7.09  ? Anxiety   ? Asthma 02/13/2009  ? from [facility] records; one note with rx for albuterol and sample of  singulair   ? GERD (gastroesophageal reflux disease)   ? ?History reviewed. No pertinent surgical history. ?Patient Active Problem List  ? Diagnosis Date Noted  ? Loss of weight 12/29/2012  ? Other malaise and fatigue 12/29/2012  ? Nausea alone 12/29/2012  ? Overweight 11/18/2012  ? Knee pain, chronic 11/18/2012  ? ?PCP: Janeece Agee, NP ? ?REFERRING PROVIDER: Janeece Agee, NP ? ?REFERRING DIAG: M25.561,M25.562,G89.29 (ICD-10-CM) - Chronic pain of both knees ? ?THERAPY DIAG:  ?Chronic pain of left knee ? ?Chronic pain of right knee ? ?Muscle weakness (generalized) ? ?ONSET DATE: 10 + years ? ?SUBJECTIVE:  ? ?SUBJECTIVE STATEMENT: ?06/25/2021 ?States that his back is feeling ok and he feels like the overall  it feels better but he hasn't had much time to stretch. States that his left knee is still about the same even with the stretching. ? ?Eval: States that he has had long lasting knee and back pain. States it is mainly his left knee. States he has had pain since  highschool, he used to wrestle in highschool and he tweaked his left knee. States that his knee flare ups with weather changes. States he doesn't know if the back pain is related to his knee pain. Back pain has been since high school. States he had PT previously and they did electrical therapy. Reports he has a popping with his knee that then causes pain. Reports that massage doesn't feel good as it is so sensitive ? ?PERTINENT HISTORY: ?ADHD, left shoulder dislocation (highschool - no intervention. ? ?PAIN:  ?Are you having pain? Yes: NPRS scale: 6/10 ?Pain location: with walking, left knee ?Pain description: dull ?Aggravating factors: bending, lifting, standing,  ?Relieving factors: rest , tiger balm, icy hot  ? ?No current knee pain. Pain is usually is a deep soreness, doesn't feel muscular ? ?PRECAUTIONS: None ? ?WEIGHT BEARING RESTRICTIONS No ? ?FALLS:  ?Has patient fallen in last 6 months? no, Number of falls: 0 ? ? ?OCCUPATION: [employer] ? ?PLOF: Independent, paint balling (wears a knee brace), juditzu  ? ?PATIENT GOALS to get back to higher function ? ? ?OBJECTIVE:  ? ?DIAGNOSTIC FINDINGS: none at this time ? ?Gait: antalgic, limps, stiff left knee ? ?COGNITION: ? Overall cognitive status: Within functional limits for tasks assessed   ?  ?SENSATION: ?WFL ? ? ?PALPATION: ?increased resting tone throughout left LE,  lumbar paraspinals and left glutes. Pain and guarding with palpation in these areas. Mostly joint line pain at left knee, limited patella mobility left but guarding noted. ? ? ? Lumbar AROM:  ?  06/18/2021     ? Flexion  50% limited (painful coming up)    ? Extension  50% limited (painful)    ? R ROT  0% limited    ? L ROT  0% limtied    ? R SB      ?L SB    ?  * Pain   ?(Blank rows = not tested) ? ? ? LE Measurements ?Lower Extremity Right ?06/18/2021 Left ?06/18/2021  ? A/PROM MMT A/PROM MMT  ?Hip Flexion  4-  3+  ?Hip Extension  4+  4-  ?Hip Abduction      ?Hip Adduction      ?Hip  Internal rotation      ?Hip External rotation      ?Knee Flexion 125 4+ 125* 4-  ?Knee Extension 0 4+ 2/0* 4-  ?Ankle Dorsiflexion  5  5  ?Ankle Plantarflexion  18 heel raises *  20 SL heel raises   ?Ankle Inversion      ?Ankle Eversion      ? (Blank rows = not tested) ? * pain ? ? SPECIAL TESTS:  ?Negative slump test bilaterally ? ? ? ?TODAY'S TREATMENT: ?06/25/2021 ?Therapeutic Exercise: ? Aerobic: ?Supine: SLR with quad set slow 2x20 L slow and controlled, bridge x25 5" holds, bridge kick-outs 2 minutes total ?Prone: press ups x5 30" holds, knee flexion x10, reviewed quad stretch x1 30" holds left ? Seated: LAQs 10 second holds at end range Left - 3 minutes total ? Standing: lumbar extension at wall 4 minutes, ?Neuromuscular Re-education: ?Manual Therapy: left knee traction - grade II/III - tolerated well - 6 minutes ?Therapeutic Activity: ?Self Care: ?Trigger Point Dry Needling:  ?Modalities:  ? ? ? ?PATIENT EDUCATION:  ?Education details: on traction and rationale for this intervention, on creating space in knee joint, on supporting knee when laying supine  ?Person educated: Patient ?Education method: Explanation, Demonstration, and Handouts ?Education comprehension: verbalized understanding ? ? ? ?HOME EXERCISE PROGRAM: ?[id number] ? ?ASSESSMENT: ? ?CLINICAL IMPRESSION: ?06/25/2021 ?Trailed standing lumbar extension which was not tolerated as well as prone lumbar extension. Patient tolerated knee traction well, reduced pain immediately reported with this exercise. Educated patient in how to perform this at home with ankle weights. Added supine exercises for hip and core strengthening, tolerated these well without pain. Fatigue noted in legs by end of session. Added all new exercises to HEP. ? ?Eval:Patient is a 26 y.o. male who was seen today for physical therapy evaluation and treatment for left knee pain and back pain. Patient presents with increased muscle guarding in lumbar spine and left LE that limited  assessment on this date. Positive response to prone press ups but previous history of shoulder dislocation that was bothered by this exercise. Educated patient in how to perform this exercise with reduced pressure on his shoulder. Patient would greatly benefit from skilled PT to improve overall function and quality of life.  ? ? ?OBJECTIVE IMPAIRMENTS Abnormal gait, decreased activity tolerance, decreased balance, decreased endurance, decreased mobility, difficulty walking, decreased ROM, decreased strength, hypomobility, and pain.  ? ?ACTIVITY LIMITATIONS cleaning, community activity, occupation, yard work, and working out.  ? ?PERSONAL FACTORS Fitness, Past/current experiences, and 1 comorbidity: history of shoulder dislocation are also affecting patient's functional outcome.  ? ? ?REHAB POTENTIAL: Excellent ? ?  CLINICAL DECISION MAKING: Stable/uncomplicated ? ?EVALUATION COMPLEXITY: Low ? ? ?GOALS: ?Goals reviewed with patient?  yes ? ?SHORT TERM GOALS: ? ?Patient will be independent in self management strategies to improve quality of life and functional outcomes. ?Baseline: new program ?Target date: 07/09/2021 ?Goal status: INITIAL ? ?2.  Patient will report at least 50% improvement in overall symptoms and/or function to demonstrate improved functional mobility ?Baseline: 0% ?Target date: 07/09/2021 ?Goal status: INITIAL ? ?3.  Patient will be able to lay prone without pain to improve ability to sleep at night. ?Baseline: pain with prone lying ?Target date: 07/09/2021 ?Goal status: INITIAL ? ?4.  Patient will be able to demonstrate pain free left knee ROM ?Baseline: painful ?Target date: 07/09/2021 ?Goal status: INITIAL ? ? ? ? ?LONG TERM GOALS: ? ?Patient will report at least 75% improvement in overall symptoms and/or function to demonstrate improved functional mobility ?Baseline: 0% ?Target date: 07/30/2021 ?Goal status: INITIAL ? ?2.  Patient will be able to demonstrate painfree lumbar ROM to improve gross lumbar  mobility ?Baseline: painful ?Target date: 07/30/2021 ?Goal status: INITIAL ? ?3.  Patient will be able to demonstrate at least 4/5 MMT in LE  ?Baseline: see above ?Target date: 07/30/2021 ?Goal status: INITIAL ? ? ? ?PLAN: ?

## 2021-06-25 ENCOUNTER — Other Ambulatory Visit (HOSPITAL_COMMUNITY): Payer: Self-pay

## 2021-06-25 ENCOUNTER — Encounter: Payer: Self-pay | Admitting: Physical Therapy

## 2021-06-25 ENCOUNTER — Ambulatory Visit (INDEPENDENT_AMBULATORY_CARE_PROVIDER_SITE_OTHER): Payer: BC Managed Care – PPO | Admitting: Physical Therapy

## 2021-06-25 DIAGNOSIS — M6281 Muscle weakness (generalized): Secondary | ICD-10-CM | POA: Diagnosis not present

## 2021-06-25 DIAGNOSIS — M25561 Pain in right knee: Secondary | ICD-10-CM

## 2021-06-25 DIAGNOSIS — G8929 Other chronic pain: Secondary | ICD-10-CM

## 2021-06-25 DIAGNOSIS — M25562 Pain in left knee: Secondary | ICD-10-CM

## 2021-06-26 ENCOUNTER — Encounter: Payer: BC Managed Care – PPO | Admitting: Physical Therapy

## 2021-07-02 ENCOUNTER — Encounter: Payer: BC Managed Care – PPO | Admitting: Physical Therapy

## 2021-07-03 ENCOUNTER — Encounter: Payer: BC Managed Care – PPO | Admitting: Physical Therapy

## 2021-07-09 ENCOUNTER — Ambulatory Visit (INDEPENDENT_AMBULATORY_CARE_PROVIDER_SITE_OTHER): Payer: BC Managed Care – PPO | Admitting: Physical Therapy

## 2021-07-09 ENCOUNTER — Encounter: Payer: Self-pay | Admitting: Physical Therapy

## 2021-07-09 ENCOUNTER — Other Ambulatory Visit (HOSPITAL_COMMUNITY): Payer: Self-pay

## 2021-07-09 DIAGNOSIS — M25561 Pain in right knee: Secondary | ICD-10-CM

## 2021-07-09 DIAGNOSIS — M6281 Muscle weakness (generalized): Secondary | ICD-10-CM | POA: Diagnosis not present

## 2021-07-09 DIAGNOSIS — G8929 Other chronic pain: Secondary | ICD-10-CM | POA: Diagnosis not present

## 2021-07-09 DIAGNOSIS — M25562 Pain in left knee: Secondary | ICD-10-CM | POA: Diagnosis not present

## 2021-07-09 NOTE — Therapy (Signed)
?OUTPATIENT PHYSICAL THERAPY TREATMENT NOTE ? ? ?Patient Name: Juan Perkins ?MRN: [id number] ?DOB:18-Dec-1995, 26 y.o., male ?Today's Date: 07/09/2021 ? ?PCP: Janeece Agee, NP ?REFERRING PROVIDER: Janeece Agee, NP ? ? PT End of Session - 07/09/21 1216   ? ? Visit Number 3   ? Number of Visits 12   ? Date for PT Re-Evaluation 07/30/21   ? Authorization Type [employer]BS 30 VL   ? Authorization - Visit Number 3   ? Authorization - Number of Visits 30   ? Progress Note Due on Visit 10   ? PT Start Time 1216   ? PT Stop Time 1256   ? PT Time Calculation (min) 40 min   ? ?  ?  ? ?  ? ? ?Past Medical History:  ?Diagnosis Date  ? ADHD (attention deficit hyperactivity disorder) 07/14/2001  ? dx from [facility] records; on Concerta, then Adderall XR to 4.08, then back to Concerta until 7.09  ? Anxiety   ? Asthma 02/13/2009  ? from [facility] records; one note with rx for albuterol and sample of  singulair   ? GERD (gastroesophageal reflux disease)   ? ?History reviewed. No pertinent surgical history. ?Patient Active Problem List  ? Diagnosis Date Noted  ? Loss of weight 12/29/2012  ? Other malaise and fatigue 12/29/2012  ? Nausea alone 12/29/2012  ? Overweight 11/18/2012  ? Knee pain, chronic 11/18/2012  ? ?PCP: Janeece Agee, NP ? ?REFERRING PROVIDER: Janeece Agee, NP ? ?REFERRING DIAG: M25.561,M25.562,G89.29 (ICD-10-CM) - Chronic pain of both knees ? ?THERAPY DIAG:  ?Chronic pain of left knee ? ?Chronic pain of right knee ? ?Muscle weakness (generalized) ? ?ONSET DATE: 10 + years ? ?SUBJECTIVE:  ? ?SUBJECTIVE STATEMENT: ?07/09/2021 ?Stats back and knee are feeling great and doing exercises every other. States that overall he feels about 80-90% better. States that getting back into things and isn't having as much pain ? ?Eval: States that he has had long lasting knee and back pain. States it is mainly his left knee. States he has had pain since highschool, he  used to wrestle in highschool and he tweaked his left knee. States that his knee flare ups with weather changes. States he doesn't know if the back pain is related to his knee pain. Back pain has been since high school. States he had PT previously and they did electrical therapy. Reports he has a popping with his knee that then causes pain. Reports that massage doesn't feel good as it is so sensitive ? ?PERTINENT HISTORY: ?ADHD, left shoulder dislocation (highschool - no intervention. ? ?PAIN:  ?Are you having pain? Yes: NPRS scale: 6/10 ?Pain location: with walking, left knee ?Pain description: dull ?Aggravating factors: bending, lifting, standing,  ?Relieving factors: rest , tiger balm, icy hot  ? ?No current knee pain. Pain is usually is a deep soreness, doesn't feel muscular ? ?PRECAUTIONS: None ? ?WEIGHT BEARING RESTRICTIONS No ? ?FALLS:  ?Has patient fallen in last 6 months? no, Number of falls: 0 ? ? ?OCCUPATION: [employer] ? ?PLOF: Independent, paint balling (wears a knee brace), juditzu  ? ?PATIENT GOALS to get back to higher function ? ? ?OBJECTIVE:  ? ?DIAGNOSTIC FINDINGS: none at this time ? ?Gait: antalgic, limps, stiff left knee ? ?COGNITION: ? Overall cognitive status: Within functional limits for tasks assessed   ?  ?SENSATION: ?WFL ? ? ?PALPATION: ?increased resting tone throughout left LE, lumbar paraspinals and left glutes. Pain and  guarding with palpation in these areas. Mostly joint line pain at left knee, limited patella mobility left but guarding noted. ? ? ? Lumbar AROM:  ?  06/18/2021     ? Flexion  50% limited (painful coming up)    ? Extension  50% limited (painful)    ? R ROT  0% limited    ? L ROT  0% limtied    ? R SB      ?L SB    ?  * Pain   ?(Blank rows = not tested) ? ? ? LE Measurements ?Lower Extremity Right ?06/18/2021 Left ?06/18/2021  ? A/PROM MMT A/PROM MMT  ?Hip Flexion  4-  3+  ?Hip Extension  4+  4-  ?Hip Abduction      ?Hip Adduction      ?Hip Internal rotation       ?Hip External rotation      ?Knee Flexion 125 4+ 125* 4-  ?Knee Extension 0 4+ 2/0* 4-  ?Ankle Dorsiflexion  5  5  ?Ankle Plantarflexion  18 heel raises *  20 SL heel raises   ?Ankle Inversion      ?Ankle Eversion      ? (Blank rows = not tested) ? * pain ? ? SPECIAL TESTS:  ?Negative slump test bilaterally ? ? ? ?TODAY'S TREATMENT: ?07/09/2021 ?Therapeutic Exercise: ? Aerobic: ?Supine: thomas stretch x3 bilateral 60" each, bridge walk outs x8  slow and controlled ?Standing: steps x25 B, eccentric step downs x10 bilateral - slight pain in front of left knee, ball wall squats x10 - slight pain in knee with increased knee flexion, tibial translation x5 L/R/C bilateral 5" holds ? ? ?PATIENT EDUCATION:  ?Education details: on anatomy and rationale for exercises ?Person educated: Patient ?Education method: Explanation, Demonstration, and Handouts ?Education comprehension: verbalized understanding ? ? ? ?HOME EXERCISE PROGRAM: ?[id number] ? ?ASSESSMENT: ? ?CLINICAL IMPRESSION: ?07/09/2021 ?Continued to progress strengthening exercises. Tolerated this well. Ball wall squats tolerated well with reduced knee flexion but increased pain noted with increased flexion. Added tibial translation stretch which was tolerated well and increased limitations noted in left knee compared to right. No pain noted end of session. Will continue with current POC as tolerated.  ? ?Eval:Patient is a 26 y.o. male who was seen today for physical therapy evaluation and treatment for left knee pain and back pain. Patient presents with increased muscle guarding in lumbar spine and left LE that limited assessment on this date. Positive response to prone press ups but previous history of shoulder dislocation that was bothered by this exercise. Educated patient in how to perform this exercise with reduced pressure on his shoulder. Patient would greatly benefit from skilled PT to improve overall function and quality of life.  ? ? ?OBJECTIVE IMPAIRMENTS  Abnormal gait, decreased activity tolerance, decreased balance, decreased endurance, decreased mobility, difficulty walking, decreased ROM, decreased strength, hypomobility, and pain.  ? ?ACTIVITY LIMITATIONS cleaning, community activity, occupation, yard work, and working out.  ? ?PERSONAL FACTORS Fitness, Past/current experiences, and 1 comorbidity: history of shoulder dislocation are also affecting patient's functional outcome.  ? ? ?REHAB POTENTIAL: Excellent ? ?CLINICAL DECISION MAKING: Stable/uncomplicated ? ?EVALUATION COMPLEXITY: Low ? ? ?GOALS: ?Goals reviewed with patient?  yes ? ?SHORT TERM GOALS: ? ?Patient will be independent in self management strategies to improve quality of life and functional outcomes. ?Baseline: new program ?Target date: 07/09/2021 ?Goal status: INITIAL ? ?2.  Patient will report at least 50% improvement in overall symptoms and/or function to  demonstrate improved functional mobility ?Baseline: 0% ?Target date: 07/09/2021 ?Goal status: INITIAL ? ?3.  Patient will be able to lay prone without pain to improve ability to sleep at night. ?Baseline: pain with prone lying ?Target date: 07/09/2021 ?Goal status: INITIAL ? ?4.  Patient will be able to demonstrate pain free left knee ROM ?Baseline: painful ?Target date: 07/09/2021 ?Goal status: INITIAL ? ? ? ? ?LONG TERM GOALS: ? ?Patient will report at least 75% improvement in overall symptoms and/or function to demonstrate improved functional mobility ?Baseline: 0% ?Target date: 07/30/2021 ?Goal status: INITIAL ? ?2.  Patient will be able to demonstrate painfree lumbar ROM to improve gross lumbar mobility ?Baseline: painful ?Target date: 07/30/2021 ?Goal status: INITIAL ? ?3.  Patient will be able to demonstrate at least 4/5 MMT in LE  ?Baseline: see above ?Target date: 07/30/2021 ?Goal status: INITIAL ? ? ? ?PLAN: ?PT FREQUENCY: 2x/week ? ?PT DURATION: 6 weeks ? ?PLANNED INTERVENTIONS: Therapeutic exercises, Therapeutic activity, Neuromuscular  re-education, Balance training, Gait training, Patient/Family education, Joint manipulation, Joint mobilization, Stair training, Orthotic/Fit training, Dry Needling, Electrical stimulation, Spinal mobilization,

## 2021-07-10 ENCOUNTER — Encounter: Payer: BC Managed Care – PPO | Admitting: Physical Therapy

## 2021-07-11 ENCOUNTER — Other Ambulatory Visit (HOSPITAL_COMMUNITY): Payer: Self-pay

## 2021-07-16 ENCOUNTER — Encounter: Payer: BC Managed Care – PPO | Admitting: Physical Therapy

## 2021-07-17 ENCOUNTER — Encounter: Payer: Self-pay | Admitting: Physical Therapy

## 2021-07-17 ENCOUNTER — Ambulatory Visit (INDEPENDENT_AMBULATORY_CARE_PROVIDER_SITE_OTHER): Payer: BC Managed Care – PPO | Admitting: Physical Therapy

## 2021-07-17 DIAGNOSIS — M25562 Pain in left knee: Secondary | ICD-10-CM | POA: Diagnosis not present

## 2021-07-17 DIAGNOSIS — M6281 Muscle weakness (generalized): Secondary | ICD-10-CM

## 2021-07-17 DIAGNOSIS — G8929 Other chronic pain: Secondary | ICD-10-CM

## 2021-07-17 DIAGNOSIS — M25561 Pain in right knee: Secondary | ICD-10-CM | POA: Diagnosis not present

## 2021-07-17 NOTE — Therapy (Signed)
?OUTPATIENT PHYSICAL THERAPY TREATMENT NOTE and Discharge Note ? ? ?PHYSICAL THERAPY DISCHARGE SUMMARY ? ?Visits from Start of Care: 4 ? ?Current functional level related to goals / functional outcomes: ?See below ?  ?Remaining deficits: ?See below ?  ?Education / Equipment: ?See below  ? ?Patient agrees to discharge. Patient goals were met. Patient is being discharged due to meeting the stated rehab goals. ? ?Patient Name: Juan Perkins ?MRN: [id number] ?DOB:December 30, 1995, 26 y.o., male ?Today's Date: 07/17/2021 ? ?PCP: Maximiano Coss, NP ?REFERRING PROVIDER: Maximiano Coss, NP ? ? PT End of Session - 07/17/21 1215   ? ? Visit Number 4   ? Number of Visits 12   ? Date for PT Re-Evaluation 07/30/21   ? Authorization Type BCBS 30 VL   ? Authorization - Visit Number 4   ? Authorization - Number of Visits 30   ? Progress Note Due on Visit 10   ? PT Start Time 1217   ? PT Stop Time 6578   ? PT Time Calculation (min) 38 min   ? ?  ?  ? ?  ? ? ?Past Medical History:  ?Diagnosis Date  ? ADHD (attention deficit hyperactivity disorder) 07/14/2001  ? dx from [facility] records; on Concerta, then Adderall XR to 4.08, then back to Concerta until 4.69  ? Anxiety   ? Asthma 02/13/2009  ? from [facility] records; one note with rx for albuterol and sample of  singulair   ? GERD (gastroesophageal reflux disease)   ? ?History reviewed. No pertinent surgical history. ?Patient Active Problem List  ? Diagnosis Date Noted  ? Loss of weight 12/29/2012  ? Other malaise and fatigue 12/29/2012  ? Nausea alone 12/29/2012  ? Overweight 11/18/2012  ? Knee pain, chronic 11/18/2012  ? ?PCP: Maximiano Coss, NP ? ?REFERRING PROVIDER: Maximiano Coss, NP ? ?REFERRING DIAG: M25.561,M25.562,G89.29 (ICD-10-CM) - Chronic pain of both knees ? ?THERAPY DIAG:  ?Chronic pain of left knee ? ?Chronic pain of right knee ? ?Muscle weakness (generalized) ? ?ONSET DATE: 10 + years ? ?SUBJECTIVE:   ? ?SUBJECTIVE STATEMENT: ?07/17/2021 ?States that knees are feeling great. Has been doing his stretches without diffifulty ? ?Eval: States that he has had long lasting knee and back pain. States it is mainly his left knee. States he has had pain since highschool, he used to wrestle in highschool and he tweaked his left knee. States that his knee flare ups with weather changes. States he doesn't know if the back pain is related to his knee pain. Back pain has been since high school. States he had PT previously and they did electrical therapy. Reports he has a popping with his knee that then causes pain. Reports that massage doesn't feel good as it is so sensitive ? ?PERTINENT HISTORY: ?ADHD, left shoulder dislocation (highschool - no intervention. ? ?PAIN:  ?Are you having pain? Yes: NPRS scale: 6/10 ?Pain location: with walking, left knee ?Pain description: dull ?Aggravating factors: bending, lifting, standing,  ?Relieving factors: rest , tiger balm, icy hot  ? ?No current knee pain. Pain is usually is a deep soreness, doesn't feel muscular ? ?PRECAUTIONS: None ? ?WEIGHT BEARING RESTRICTIONS No ? ?FALLS:  ?Has patient fallen in last 6 months? no, Number of falls: 0 ? ? ?OCCUPATION: [employer] ? ?PLOF: Independent, paint balling (wears a knee brace), juditzu  ? ?PATIENT GOALS to get back to higher function ? ? ?OBJECTIVE:  ? ?DIAGNOSTIC FINDINGS: none at this  time ? ?Gait: antalgic, limps, stiff left knee ? ?COGNITION: ? Overall cognitive status: Within functional limits for tasks assessed   ?  ?SENSATION: ?WFL ? ? ?PALPATION: ?increased resting tone throughout left LE, lumbar paraspinals and left glutes. Pain and guarding with palpation in these areas. Mostly joint line pain at left knee, limited patella mobility left but guarding noted. ? ? ? Lumbar AROM:  ?  06/18/2021   07/17/2021  ? Flexion  50% limited (painful coming up)  0% limited  ? Extension  50% limited (painful)  0% limited  ? R ROT  0% limited   0% limited  ? L ROT  0% limtied  0% limited  ? R SB      ?L SB    ?  * Pain   ?(Blank rows = not tested) ? ? ? LE Measurements ?Lower Extremity Right ?07/17/2021 Left ?07/17/2021  ? A/PROM MMT A/PROM MMT  ?Hip Flexion  5  5  ?Hip Extension  5    ?Hip Abduction      ?Hip Adduction      ?Hip Internal rotation      ?Hip External rotation      ?Knee Flexion 125 5 125 5  ?Knee Extension 0 5 0 5  ?Ankle Dorsiflexion  5  5  ?Ankle Plantarflexion  20 heel raises   20 SL heel raises   ?Ankle Inversion      ?Ankle Eversion      ? (Blank rows = not tested) ? * pain ? ? SPECIAL TESTS:  ?Negative slump test bilaterally ? ? ? ?TODAY'S TREATMENT: ?07/17/2021 ?Therapeutic Exercise: ? Aerobic: ?Supine: child's pose modified with ball L/R/C - with ball 5 minutes total  ?Standing: lunge walks x4 of 30 feet, bent knee SL heel raise ?tibial translation/R/C bilateral 5" holds 2 minutes each leg ?Neuro Re-ed: lifting form for deadlifts - standard vs sumo use of heel lift vs raise platform - practice and prior demo 10 minutes total ? ?PATIENT EDUCATION:  ?Education details: on current presentation, on progress made ?Person educated: Patient ?Education method: Explanation, Demonstration, and Handouts ?Education comprehension: verbalized understanding ? ? ? ?HOME EXERCISE PROGRAM: ?[id number] ? ?ASSESSMENT: ? ?CLINICAL IMPRESSION: ?07/17/2021 ?All goals met at this time. Reviewed lifting form and mechanics on this day to reduce risk of re-injury. Answered all questions and patient to discharge from PT to HEP at this time secondary to progress made. ? ?Eval:Patient is a 26 y.o. male who was seen today for physical therapy evaluation and treatment for left knee pain and back pain. Patient presents with increased muscle guarding in lumbar spine and left LE that limited assessment on this date. Positive response to prone press ups but previous history of shoulder dislocation that was bothered by this exercise. Educated patient in how to perform this  exercise with reduced pressure on his shoulder. Patient would greatly benefit from skilled PT to improve overall function and quality of life.  ? ? ?OBJECTIVE IMPAIRMENTS Abnormal gait, decreased activity tolerance, decreased balance, decreased endurance, decreased mobility, difficulty walking, decreased ROM, decreased strength, hypomobility, and pain.  ? ?ACTIVITY LIMITATIONS cleaning, community activity, occupation, yard work, and working out.  ? ?PERSONAL FACTORS Fitness, Past/current experiences, and 1 comorbidity: history of shoulder dislocation are also affecting patient's functional outcome.  ? ? ?REHAB POTENTIAL: Excellent ? ?CLINICAL DECISION MAKING: Stable/uncomplicated ? ?EVALUATION COMPLEXITY: Low ? ? ?GOALS: ?Goals reviewed with patient?  yes ? ?SHORT TERM GOALS: ? ?Patient will be independent in self management  strategies to improve quality of life and functional outcomes. ?Baseline: new program ?Target date: 07/09/2021 ?Goal status: MET ? ?2.  Patient will report at least 50% improvement in overall symptoms and/or function to demonstrate improved functional mobility ?Baseline: 0% ?Target date: 07/09/2021 ?Goal status: MET ? ?3.  Patient will be able to lay prone without pain to improve ability to sleep at night. ?Baseline: pain with prone lying ?Target date: 07/09/2021 ?Goal status: MET ? ?4.  Patient will be able to demonstrate pain free left knee ROM ?Baseline: painful ?Target date: 07/09/2021 ?Goal status: MET ? ? ? ? ?LONG TERM GOALS: ? ?Patient will report at least 75% improvement in overall symptoms and/or function to demonstrate improved functional mobility ?Baseline: 0% ?Target date: 07/30/2021 ?Goal status: MET ? ?2.  Patient will be able to demonstrate painfree lumbar ROM to improve gross lumbar mobility ?Baseline: painful ?Target date: 07/30/2021 ?Goal status: MET ? ?3.  Patient will be able to demonstrate at least 4/5 MMT in LE  ?Baseline: see above ?Target date: 07/30/2021 ?Goal status:  MET ? ? ? ?PLAN: ?PT FREQUENCY: 2x/week ? ?PT DURATION: 6 weeks ? ?PLANNED INTERVENTIONS: Therapeutic exercises, Therapeutic activity, Neuromuscular re-education, Balance training, Gait training, Patient/Family educati

## 2021-10-22 ENCOUNTER — Encounter: Payer: Self-pay | Admitting: Registered Nurse

## 2021-10-22 ENCOUNTER — Other Ambulatory Visit (HOSPITAL_COMMUNITY): Payer: Self-pay

## 2021-10-22 ENCOUNTER — Ambulatory Visit (INDEPENDENT_AMBULATORY_CARE_PROVIDER_SITE_OTHER): Payer: BC Managed Care – PPO | Admitting: Registered Nurse

## 2021-10-22 ENCOUNTER — Other Ambulatory Visit (HOSPITAL_COMMUNITY)
Admission: RE | Admit: 2021-10-22 | Discharge: 2021-10-22 | Disposition: A | Discharge status: Home / Self Care | Payer: BC Managed Care – PPO | Source: Ambulatory Visit | Attending: Registered Nurse | Admitting: Registered Nurse

## 2021-10-22 ENCOUNTER — Other Ambulatory Visit: Payer: Self-pay | Admitting: Registered Nurse

## 2021-10-22 VITALS — BP 120/82 | HR 78 | Temp 98.3°F | Resp 18 | Ht 71.0 in | Wt 232.8 lb

## 2021-10-22 DIAGNOSIS — D72819 Decreased white blood cell count, unspecified: Secondary | ICD-10-CM

## 2021-10-22 DIAGNOSIS — Z1329 Encounter for screening for other suspected endocrine disorder: Secondary | ICD-10-CM

## 2021-10-22 DIAGNOSIS — M7989 Other specified soft tissue disorders: Secondary | ICD-10-CM

## 2021-10-22 DIAGNOSIS — L989 Disorder of the skin and subcutaneous tissue, unspecified: Secondary | ICD-10-CM

## 2021-10-22 DIAGNOSIS — Z130 Encounter for screening for diseases of the blood and blood-forming organs and certain disorders involving the immune mechanism: Secondary | ICD-10-CM | POA: Diagnosis not present

## 2021-10-22 DIAGNOSIS — Z13228 Encounter for screening for other metabolic disorders: Secondary | ICD-10-CM | POA: Diagnosis not present

## 2021-10-22 DIAGNOSIS — F902 Attention-deficit hyperactivity disorder, combined type: Secondary | ICD-10-CM

## 2021-10-22 DIAGNOSIS — Z113 Encounter for screening for infections with a predominantly sexual mode of transmission: Secondary | ICD-10-CM | POA: Insufficient documentation

## 2021-10-22 DIAGNOSIS — Z13220 Encounter for screening for lipoid disorders: Secondary | ICD-10-CM

## 2021-10-22 DIAGNOSIS — R7989 Other specified abnormal findings of blood chemistry: Secondary | ICD-10-CM

## 2021-10-22 LAB — CBC WITH DIFFERENTIAL/PLATELET
Basophils Absolute: 0 10*3/uL (ref 0.0–0.1)
Basophils Relative: 0.7 % (ref 0.0–3.0)
Eosinophils Absolute: 0.1 10*3/uL (ref 0.0–0.7)
Eosinophils Relative: 3 % (ref 0.0–5.0)
HCT: 45.1 % (ref 39.0–52.0)
Hemoglobin: 15.4 g/dL (ref 13.0–17.0)
Lymphocytes Relative: 51.5 % — ABNORMAL HIGH (ref 12.0–46.0)
Lymphs Abs: 1.5 10*3/uL (ref 0.7–4.0)
MCHC: 34.2 g/dL (ref 30.0–36.0)
MCV: 102.1 fl — ABNORMAL HIGH (ref 78.0–100.0)
Monocytes Absolute: 0.3 10*3/uL (ref 0.1–1.0)
Monocytes Relative: 9.2 % (ref 3.0–12.0)
Neutro Abs: 1.1 10*3/uL — ABNORMAL LOW (ref 1.4–7.7)
Neutrophils Relative %: 35.6 % — ABNORMAL LOW (ref 43.0–77.0)
Platelets: 160 10*3/uL (ref 150.0–400.0)
RBC: 4.41 Mil/uL (ref 4.22–5.81)
RDW: 13.1 % (ref 11.5–15.5)
WBC: 3 10*3/uL — ABNORMAL LOW (ref 4.0–10.5)

## 2021-10-22 LAB — COMPREHENSIVE METABOLIC PANEL
ALT: 92 U/L — ABNORMAL HIGH (ref 0–53)
AST: 38 U/L — ABNORMAL HIGH (ref 0–37)
Albumin: 4.5 g/dL (ref 3.5–5.2)
Alkaline Phosphatase: 67 U/L (ref 39–117)
BUN: 11 mg/dL (ref 6–23)
CO2: 31 mEq/L (ref 19–32)
Calcium: 9.5 mg/dL (ref 8.4–10.5)
Chloride: 103 mEq/L (ref 96–112)
Creatinine, Ser: 1.14 mg/dL (ref 0.40–1.50)
GFR: 87.58 mL/min (ref >60.00)
Glucose, Bld: 96 mg/dL (ref 70–99)
Potassium: 4 mEq/L (ref 3.5–5.1)
Sodium: 140 mEq/L (ref 135–145)
Total Bilirubin: 0.4 mg/dL (ref 0.2–1.2)
Total Protein: 7.5 g/dL (ref 6.0–8.3)

## 2021-10-22 LAB — LIPID PANEL
Cholesterol: 184 mg/dL (ref 0–200)
HDL: 45.2 mg/dL (ref >39.00)
LDL Cholesterol: 127 mg/dL — ABNORMAL HIGH (ref 0–99)
NonHDL: 138.59
Total CHOL/HDL Ratio: 4
Triglycerides: 57 mg/dL (ref 0.0–149.0)
VLDL: 11.4 mg/dL (ref 0.0–40.0)

## 2021-10-22 LAB — TSH: TSH: 1.38 u[IU]/mL (ref 0.35–5.50)

## 2021-10-22 LAB — HEMOGLOBIN A1C: Hgb A1c MFr Bld: 5.8 % (ref 4.6–6.5)

## 2021-10-22 MED ORDER — VALACYCLOVIR HCL 1 G PO TABS
1000.0000 mg | ORAL_TABLET | Freq: Two times a day (BID) | ORAL | 0 refills | Status: AC
  Filled 2021-10-22: qty 20, 10d supply, fill #0

## 2021-10-22 MED ORDER — LISDEXAMFETAMINE DIMESYLATE 50 MG PO CAPS
50.0000 mg | ORAL_CAPSULE | Freq: Every day | ORAL | 0 refills | Status: DC
  Filled 2021-10-22: qty 30, 30d supply, fill #0
  Filled 2022-02-10 – 2022-02-13 (×2): qty 30, 30d supply, fill #1

## 2021-10-22 MED ORDER — SULFAMETHOXAZOLE-TRIMETHOPRIM 800-160 MG PO TABS
1.0000 | ORAL_TABLET | Freq: Two times a day (BID) | ORAL | 0 refills | Status: DC
  Filled 2021-10-22: qty 14, 7d supply, fill #0

## 2021-10-22 NOTE — Patient Instructions (Addendum)
Juan Perkins -   Juan Perkins to see you  I will refer to vascular to investigate the foot swelling  I'll let you know how labs look  Check out these providers as next PCP:  Jarold Motto, PA Jacquiline Doe, MD Edwina Barth, MD Letta Moynahan Early, NP Jiles Prows, DNP Glenetta Hew, MD  Thank you for letting me take part in your care,  [name]     If you have lab work done today you will be contacted with your lab results within the next 2 weeks.  If you have not heard from [place] then please contact us. The fastest way to get your results is to register for My Chart.   IF you received an x-ray today, you will receive an invoice from [facility]. Please contact [facility] at [phone] with questions or concerns regarding your invoice.   IF you received labwork today, you will receive an invoice from [facility]. Please contact [facility] at [phone] with questions or concerns regarding your invoice.   Our billing staff will not be able to assist you with questions regarding bills from these companies.  You will be contacted with the lab results as soon as they are available. The fastest way to get your results is to activate your My Chart account. Instructions are located on the last page of this paperwork. If you have not heard from [place] regarding the results in 2 weeks, please contact this office.

## 2021-10-22 NOTE — Progress Notes (Signed)
Established Patient Office Visit  Subjective:  Patient ID: Juan Perkins, male    DOB: 11/20/1995  Age: 26 y.o. MRN: [id number]  CC:  Chief Complaint  Patient presents with   Foot Swelling    Patient states he is here for some right foot swelling when he eats certain things and some irregular bowel.   SEXUALLY TRANSMITTED DISEASE    Patient wants to get routine testing for STD    HPI WESSLEY EMERT presents for foot swelling, STD screen, med check  Foot swelling R foot only Some pain in base of great toe and towards lower part of anterior tibia Constant pain. Sharp.  Swelling happens when eating salty or greasy foods. Remote hx of fracture ankle around age 34. Otherwise no injury or trauma No other patterns besides diet. No other GI symptoms.  STD Routine screen No symptoms No known exposure.  ADHD Vyvanse 50mg  po qd  Good effect, no AE. Hopes to continue   Outpatient Medications Prior to Visit  Medication Sig Dispense Refill   diclofenac (VOLTAREN) 75 MG EC tablet Take 1 tablet (75 mg total) by mouth 2 (two) times daily. 30 tablet 0   methocarbamol (ROBAXIN) 500 MG tablet Take 1 tablet (500 mg total) by mouth every 8 (eight) hours as needed for muscle spasms. 60 tablet 0   valACYclovir (VALTREX) 500 MG tablet Take 1 tablet (500 mg total) by mouth daily. 90 tablet 3   lisdexamfetamine (VYVANSE) 50 MG capsule Take 1 capsule (50 mg total) by mouth daily. 30 capsule 0   No facility-administered medications prior to visit.    Review of Systems  Constitutional: Negative.   HENT: Negative.    Eyes: Negative.   Respiratory: Negative.    Cardiovascular: Negative.   Gastrointestinal: Negative.   Genitourinary: Negative.   Musculoskeletal: Negative.   Skin: Negative.   Neurological: Negative.   Psychiatric/Behavioral: Negative.    All other systems reviewed and are negative.     Objective:     Pulse 78   Temp 98.3 F (36.8 C) (Temporal)   Resp 18    Ht 5\' 11"  (1.803 m)   Wt 232 lb 12.8 oz (105.6 kg)   SpO2 98%   BMI 32.47 kg/m   Wt Readings from Last 3 Encounters:  10/22/21 232 lb 12.8 oz (105.6 kg)  06/04/21 232 lb 3.2 oz (105.3 kg)  02/27/21 240 lb (108.9 kg)   Physical Exam Constitutional:      General: He is not in acute distress.    Appearance: Normal appearance. He is normal weight. He is not ill-appearing, toxic-appearing or diaphoretic.  Cardiovascular:     Rate and Rhythm: Normal rate and regular rhythm.     Heart sounds: Normal heart sounds. No murmur heard.    No friction rub. No gallop.  Pulmonary:     Effort: Pulmonary effort is normal. No respiratory distress.     Breath sounds: Normal breath sounds. No stridor. No wheezing, rhonchi or rales.  Chest:     Chest wall: No tenderness.  Genitourinary:    Comments: R buttock boil Skin:    General: Skin is warm and dry.     Coloration: Skin is not jaundiced or pale.     Findings: No bruising, erythema, lesion or rash.     Comments: R foot cap refill delayed compared to left R - 2-3 s L - 1 s   Neurological:     General: No focal deficit present.  Mental Status: He is alert and oriented to person, place, and time. Mental status is at baseline.  Psychiatric:        Mood and Affect: Mood normal.        Behavior: Behavior normal.        Thought Content: Thought content normal.        Judgment: Judgment normal.     No results found for any visits on 10/22/21.    The ASCVD Risk score (Arnett DK, et al., 2019) failed to calculate for the following reasons:   The 2019 ASCVD risk score is only valid for ages 35 to 24    Assessment & Plan:   Problem List Items Addressed This Visit   None Visit Diagnoses     Skin lesion    -  Primary   Relevant Medications   sulfamethoxazole-trimethoprim (BACTRIM DS) 800-160 MG tablet   valACYclovir (VALTREX) 1000 MG tablet   Screen for STD (sexually transmitted disease)       Relevant Orders   Urine cytology  ancillary only([place])   HIV antibody (with reflex)   RPR   Screening for endocrine, metabolic and immunity disorder       Relevant Orders   CBC with Differential/Platelet   Comprehensive metabolic panel   Hemoglobin A1c   TSH   Lipid screening       Relevant Orders   Lipid panel   Attention deficit hyperactivity disorder (ADHD), combined type       Relevant Medications   lisdexamfetamine (VYVANSE) 50 MG capsule   Swelling of right foot       Relevant Orders   Ambulatory referral to Vascular Surgery       Meds ordered this encounter  Medications   sulfamethoxazole-trimethoprim (BACTRIM DS) 800-160 MG tablet    Sig: Take 1 tablet by mouth 2 (two) times daily.    Dispense:  14 tablet    Refill:  0    Order Specific Question:   Supervising Provider    Answer:   Neva Seat, JEFFREY R [[id number]]   valACYclovir (VALTREX) 1000 MG tablet    Sig: Take 1 tablet (1,000 mg total) by mouth 2 (two) times daily for 10 days.    Dispense:  20 tablet    Refill:  0    Order Specific Question:   Supervising Provider    Answer:   Neva Seat, JEFFREY R [[id number]]   lisdexamfetamine (VYVANSE) 50 MG capsule    Sig: Take 1 capsule (50 mg total) by mouth daily.    Dispense:  90 capsule    Refill:  0    Order Specific Question:   Supervising Provider    Answer:   Neva Seat, JEFFREY R [[id number]]    Return if symptoms worsen or fail to improve.   PLAN Unclear etiology to swelling. Given delayed cap refill, will refer to vascular Labs collected. Will follow up with the patient as warranted. Refill vyvanse Bactrim for boil. Will also give inc dose of valtrex x 1 week Patient encouraged to call clinic with any questions, comments, or concerns.   Janeece Agee, NP

## 2021-10-23 LAB — URINE CYTOLOGY ANCILLARY ONLY
Chlamydia: NEGATIVE
Comment: NEGATIVE
Comment: NEGATIVE
Comment: NORMAL
Neisseria Gonorrhea: NEGATIVE
Trichomonas: NEGATIVE

## 2021-10-24 ENCOUNTER — Other Ambulatory Visit: Payer: Self-pay | Admitting: *Deleted

## 2021-10-24 ENCOUNTER — Encounter: Payer: Self-pay | Admitting: Registered Nurse

## 2021-10-24 DIAGNOSIS — D72819 Decreased white blood cell count, unspecified: Secondary | ICD-10-CM

## 2021-10-24 LAB — HIV ANTIBODY (ROUTINE TESTING W REFLEX): HIV 1&2 Ab, 4th Generation: NONREACTIVE

## 2021-10-24 LAB — RPR: RPR Ser Ql: NONREACTIVE

## 2021-10-25 ENCOUNTER — Other Ambulatory Visit (HOSPITAL_COMMUNITY): Payer: Self-pay

## 2021-10-28 ENCOUNTER — Ambulatory Visit
Admission: RE | Admit: 2021-10-28 | Discharge: 2021-10-28 | Disposition: A | Discharge status: Home / Self Care | Payer: BC Managed Care – PPO | Source: Ambulatory Visit | Attending: Registered Nurse | Admitting: Registered Nurse

## 2021-10-28 ENCOUNTER — Telehealth: Payer: Self-pay | Admitting: Oncology

## 2021-10-28 DIAGNOSIS — R7989 Other specified abnormal findings of blood chemistry: Secondary | ICD-10-CM

## 2021-10-28 DIAGNOSIS — R945 Abnormal results of liver function studies: Secondary | ICD-10-CM | POA: Diagnosis not present

## 2021-10-28 NOTE — Telephone Encounter (Signed)
Attempted to contact patient in regards to referral, no answer so voicemail was left for patient to call back  

## 2021-10-30 ENCOUNTER — Telehealth: Payer: Self-pay | Admitting: Oncology

## 2021-10-30 NOTE — Telephone Encounter (Signed)
Attempted to contact patient in regards to referral, no answer so voicemail was left for patient to call back  

## 2021-11-21 ENCOUNTER — Other Ambulatory Visit (HOSPITAL_COMMUNITY): Payer: Self-pay

## 2021-11-21 DIAGNOSIS — L0591 Pilonidal cyst without abscess: Secondary | ICD-10-CM | POA: Diagnosis not present

## 2021-11-21 MED ORDER — DOXYCYCLINE MONOHYDRATE 100 MG PO CAPS
ORAL_CAPSULE | ORAL | 0 refills | Status: DC
  Filled 2021-11-21: qty 4, 2d supply, fill #0
  Filled 2021-11-21: qty 16, 8d supply, fill #0

## 2021-12-09 ENCOUNTER — Other Ambulatory Visit: Payer: Self-pay | Admitting: *Deleted

## 2021-12-09 DIAGNOSIS — M7989 Other specified soft tissue disorders: Secondary | ICD-10-CM

## 2021-12-20 ENCOUNTER — Ambulatory Visit (HOSPITAL_COMMUNITY)
Admission: RE | Admit: 2021-12-20 | Discharge: 2021-12-20 | Disposition: A | Discharge status: Home / Self Care | Payer: BC Managed Care – PPO | Source: Ambulatory Visit | Attending: Vascular Surgery | Admitting: Vascular Surgery

## 2021-12-20 DIAGNOSIS — M7989 Other specified soft tissue disorders: Secondary | ICD-10-CM | POA: Diagnosis not present

## 2021-12-23 NOTE — Progress Notes (Unsigned)
VASCULAR [facility] VEIN SPECIALISTS           OF [place]  History and Physical   Juan Perkins is a 26 y.o. male who presents with RLE swelling.  He states that he does not always have swelling in the right leg.  He notices it more if has not eaten healthy for several days.  When he does have swelling, elevation helps.  He does not really wear compression.  He does not have hx of DVT.  He has never had any surgeries.  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  He has been going to the gym more and feels this does make a difference.    He is a current smoker.   The pt is not on a statin for cholesterol management.  The pt is not on a daily aspirin.   Other AC:  none The pt is not on medication for hypertension.   The pt is not diabetic.   Tobacco hx:  current  Pt does not have family hx of AAA.  Past Medical History:  Diagnosis Date   ADHD (attention deficit hyperactivity disorder) 07/14/2001   dx from [facility] (now [facility]) records; on Concerta, then Adderall XR to 4.08, then back to Concerta until 7.09   Anxiety    Asthma 02/13/2009   from [place] [facility]now [place] records; one note with rx for albuterol and sample of  singulair    GERD (gastroesophageal reflux disease)     No past surgical history on file.  Social History   Socioeconomic History   Marital status: Media planner    Spouse name: Not on file   Number of children: 0   Years of education: Not on file   Highest education level: Not on file  Occupational History   Occupation: [employer]  Tobacco Use   Smoking status: Every Day    Packs/day: 0.50    Years: 5.00    Total pack years: 2.50    Types: Cigarettes   Smokeless tobacco: Never  Vaping Use   Vaping Use: Never used  Substance and Sexual Activity   Alcohol use: Yes    Alcohol/week: 14.0 standard drinks of alcohol    Types: 4 Cans of beer, 10 Shots of liquor per week    Comment:  weekly   Drug use: Yes    Frequency: 5.0 times per week    Types: Marijuana   Sexual activity: Yes    Birth control/protection: None  Other Topics Concern   Not on file  Social History Narrative   Not on file   Social Determinants of Health   Financial Resource Strain: Not on file  Food Insecurity: Not on file  Transportation Needs: Not on file  Physical Activity: Not on file  Stress: Not on file  Social Connections: Not on file  Intimate Partner Violence: Not on file     Family History  Problem Relation Age of Onset   Diabetes Paternal Grandfather    Hypertension Paternal Grandfather    Diabetes Father    Hypertension Father    Hypertension Maternal Grandmother     Current Outpatient Medications  Medication Sig Dispense Refill   diclofenac (VOLTAREN) 75 MG EC tablet Take 1 tablet (75 mg total) by mouth 2 (two) times daily. 30 tablet 0   doxycycline (MONODOX) 100 MG capsule Take 1 capsule by mouth in the morning and 1  capsule  in the evening for 10 days. Take with at least 8 ounces (large glass) of water, do not lie down for 30 minutes after. 20 capsule 0   lisdexamfetamine (VYVANSE) 50 MG capsule Take 1 capsule (50 mg total) by mouth daily. 90 capsule 0   methocarbamol (ROBAXIN) 500 MG tablet Take 1 tablet (500 mg total) by mouth every 8 (eight) hours as needed for muscle spasms. 60 tablet 0   sulfamethoxazole-trimethoprim (BACTRIM DS) 800-160 MG tablet Take 1 tablet by mouth 2 (two) times daily. 14 tablet 0   valACYclovir (VALTREX) 500 MG tablet Take 1 tablet (500 mg total) by mouth daily. 90 tablet 3   No current facility-administered medications for this visit.    Allergies  Allergen Reactions   Other     FRESH FRUIT    REVIEW OF SYSTEMS:   [X]  denotes positive finding, [ ]  denotes negative finding Cardiac  Comments:  Chest pain or chest pressure:    Shortness of breath upon exertion:    Short of breath when lying flat:    Irregular heart rhythm:         Vascular    Pain in calf, thigh, or hip brought on by ambulation:    Pain in feet at night that wakes you up from your sleep:     Blood clot in your veins:    Leg swelling:  x       Pulmonary    Oxygen at home:    Productive cough:     Wheezing:         Neurologic    Sudden weakness in arms or legs:     Sudden numbness in arms or legs:     Sudden onset of difficulty speaking or slurred speech:    Temporary loss of vision in one eye:     Problems with dizziness:         Gastrointestinal    Blood in stool:     Vomited blood:         Genitourinary    Burning when urinating:     Blood in urine:        Psychiatric    Major depression:         Hematologic    Bleeding problems:    Problems with blood clotting too easily:        Skin    Rashes or ulcers:        Constitutional    Fever or chills:      PHYSICAL EXAMINATION:  Today's Vitals   12/24/21 0958  BP: 122/79  Pulse: (!) 51  Resp: 20  Temp: 98.2 F (36.8 C)  TempSrc: Temporal  SpO2: 99%  Weight: 242 lb 8 oz (110 kg)  Height: 5\' 11"  (1.803 m)  PainSc: 0-No pain   Body mass index is 33.82 kg/m.   General:  WDWN in NAD; vital signs documented above Gait: Normal HENT: WNL, normocephalic Pulmonary: normal non-labored breathing without wheezing Cardiac: regular HR; without carotid bruits Abdomen: soft, NT, aortic pulse is not palpable Skin: without rashes Vascular Exam/Pulses:  Right Left  Radial 2+ (normal) 2+ (normal)  DP 2+ (normal) 2+ (normal)   Extremities: mild bilateral pitting edema lower legs from socks  Neurologic: A&O X 3;  moving all extremities equally Psychiatric:  The pt has Normal affect.   Non-Invasive Vascular Imaging:   Venous duplex on 12/20/2021: Venous Reflux Times  +--------------+---------+------+-----------+------------+--------+  RIGHT  Reflux NoRefluxReflux TimeDiameter cmsComments                          Yes                                    +--------------+---------+------+-----------+------------+--------+  CFV                     yes   >1 second                       +--------------+---------+------+-----------+------------+--------+  GSV at [facility]    no                            0.75              +--------------+---------+------+-----------+------------+--------+  GSV prox thighno                            0.45              +--------------+---------+------+-----------+------------+--------+  GSV mid thigh no                            0.47              +--------------+---------+------+-----------+------------+--------+  GSV dist thighno                            0.58              +--------------+---------+------+-----------+------------+--------+  GSV at knee   no                            0.44              +--------------+---------+------+-----------+------------+--------+  GSV prox calf no                            0.55              +--------------+---------+------+-----------+------------+--------+  GSV mid calf  no                            0.43              +--------------+---------+------+-----------+------------+--------+  SSV Pop Fossa no                            0.43              +--------------+---------+------+-----------+------------+--------+  SSV prox calf no                            0.25              +--------------+---------+------+-----------+------------+--------+  SSV mid calf            yes    >500 ms      0.24              +--------------+---------+------+-----------+------------+--------+  AASV O        no  0.35              +--------------+---------+------+-----------+------------+--------+  AASV prx      no                            0.35              +--------------+---------+------+-----------+------------+--------+  AASV mid      no                                               +--------------+---------+------+-----------+------------+--------+   Summary:  Right:  - No evidence of deep vein thrombosis seen in the right lower extremity, from the common femoral through the popliteal veins.  - No evidence of superficial venous reflux seen in the right greater saphenous vein.  - Venous reflux is noted in the right common femoral vein.  - Venous reflux is noted in the right short saphenous vein.  - No venous reflux is noted in the AASV    Juan Perkins is a 26 y.o. male who presents with: RLE swelling    Leg swelling -pt has easily palpable DP pedal pulses bilaterally.  He states he only has right leg swelling intermittently but on exam today, he does have some mild pitting edema from his socks bilaterally.   He states it improves when he is eating a healthier diet.  Encouraged him to watch his sodium intake  -pt does not have evidence of DVT.  Pt does have venous reflux in the CFV and the SSV mid calf but no other venous reflux.  Given this, pt is not a candidate for venous laser ablation.   -discussed with pt about wearing knee high 15-20 mmHg compression stockings  He states he does have some copper fit socks that he will try.   -discussed the importance of leg elevation and how to elevate properly - pt is advised to elevate their legs and a diagram is given to them to demonstrate for pt to lay flat on their back with knees elevated and slightly bent with their feet higher than their knees, which puts their feet higher than their heart for 15 minutes per day.  If pt cannot lay flat, advised to lay as flat as possible.  -pt is advised to continue as much walking as possible and avoid sitting or standing for long periods of time.  He currently has a desk job and encouraged him to get up frequently to walk around during the day.  -discussed importance of maintaining a healthy weight and exercise and that water aerobics would also be beneficial.  He is going  to the gym for exercise.   -handout with recommendations given -pt will f/u as needed  Current smoker -discussed the importance of smoking cessation with risk of PAD, CAD, stroke and cancer.     Doreatha Massed, [facility] [phone]  Clinic MD:  Chestine Spore

## 2021-12-24 ENCOUNTER — Ambulatory Visit (INDEPENDENT_AMBULATORY_CARE_PROVIDER_SITE_OTHER): Payer: BC Managed Care – PPO | Admitting: Physician Assistant

## 2021-12-24 ENCOUNTER — Encounter: Payer: Self-pay | Admitting: Physician Assistant

## 2021-12-24 VITALS — BP 122/79 | HR 51 | Temp 98.2°F | Resp 20 | Ht 71.0 in | Wt 242.5 lb

## 2021-12-24 DIAGNOSIS — M7989 Other specified soft tissue disorders: Secondary | ICD-10-CM

## 2022-02-12 ENCOUNTER — Other Ambulatory Visit (HOSPITAL_COMMUNITY): Payer: Self-pay

## 2022-02-13 ENCOUNTER — Other Ambulatory Visit (HOSPITAL_COMMUNITY): Payer: Self-pay

## 2022-03-11 ENCOUNTER — Other Ambulatory Visit (HOSPITAL_COMMUNITY): Payer: Self-pay

## 2022-03-11 ENCOUNTER — Ambulatory Visit (INDEPENDENT_AMBULATORY_CARE_PROVIDER_SITE_OTHER): Payer: BC Managed Care – PPO | Admitting: Family

## 2022-03-11 ENCOUNTER — Encounter: Payer: Self-pay | Admitting: Family

## 2022-03-11 VITALS — BP 124/82 | HR 67 | Temp 98.4°F | Ht 71.0 in | Wt 237.6 lb

## 2022-03-11 DIAGNOSIS — J101 Influenza due to other identified influenza virus with other respiratory manifestations: Secondary | ICD-10-CM

## 2022-03-11 DIAGNOSIS — R059 Cough, unspecified: Secondary | ICD-10-CM

## 2022-03-11 LAB — POC INFLUENZA A&B (BINAX/QUICKVUE)
Influenza A, POC: POSITIVE — AB
Influenza B, POC: NEGATIVE

## 2022-03-11 LAB — POC COVID19 BINAXNOW: SARS Coronavirus 2 Ag: NEGATIVE

## 2022-03-11 MED ORDER — PROMETHAZINE-DM 6.25-15 MG/5ML PO SYRP
5.0000 mL | ORAL_SOLUTION | Freq: Four times a day (QID) | ORAL | 0 refills | Status: DC | PRN
  Filled 2022-03-11: qty 118, 6d supply, fill #0

## 2022-03-11 MED ORDER — OSELTAMIVIR PHOSPHATE 75 MG PO CAPS
75.0000 mg | ORAL_CAPSULE | Freq: Two times a day (BID) | ORAL | 0 refills | Status: DC
  Filled 2022-03-11: qty 10, 5d supply, fill #0

## 2022-03-11 NOTE — Progress Notes (Unsigned)
Acute Office Visit  Subjective:     Patient ID: Juan Perkins, male    DOB: March 09, 1996, 26 y.o.   MRN: [id number]  Chief Complaint  Patient presents with  . Cough    Pt states he has fever, cough, body aches, chills, headache. Pt took covid test and was negative, symptoms started Sunday     HPI Patient is in today with c/o cough, congestion, fever, upset stomach, diarrhea, and headache x 2 days and worsening. He took a covid test that was negative. Has not had an influenza vaccine. Has been taking Mucinex and Tylenol.  Review of Systems  Constitutional:  Positive for chills and fever.  HENT:  Positive for congestion. Negative for sore throat.   Respiratory:  Positive for cough. Negative for shortness of breath.   Cardiovascular: Negative.  Negative for chest pain.  Gastrointestinal: Negative.   Musculoskeletal: Negative.   Neurological: Negative.   Psychiatric/Behavioral: Negative.    All other systems reviewed and are negative. Past Medical History:  Diagnosis Date  . ADHD (attention deficit hyperactivity disorder) 07/14/2001   dx from [facility] (now [facility]) records; on Concerta, then Adderall XR to 4.08, then back to Concerta until 7.09  . Anxiety   . Asthma 02/13/2009   from [place] [facility] [place] records; one note with rx for albuterol and sample of  singulair   . GERD (gastroesophageal reflux disease)     Social History   Socioeconomic History  . Marital status: Media planner    Spouse name: Not on file  . Number of children: 0  . Years of education: Not on file  . Highest education level: Not on file  Occupational History  . Occupation: [employer]  Tobacco Use  . Smoking status: Every Day    Packs/day: 0.50    Years: 5.00    Total pack years: 2.50    Types: Cigarettes    Passive exposure: Current  . Smokeless tobacco: Never  Vaping Use  . Vaping Use: Never used  Substance and Sexual Activity  .  Alcohol use: Yes    Alcohol/week: 14.0 standard drinks of alcohol    Types: 4 Cans of beer, 10 Shots of liquor per week    Comment: weekly  . Drug use: Yes    Frequency: 5.0 times per week    Types: Marijuana  . Sexual activity: Yes    Birth control/protection: None  Other Topics Concern  . Not on file  Social History Narrative  . Not on file   Social Determinants of Health   Financial Resource Strain: Not on file  Food Insecurity: Not on file  Transportation Needs: Not on file  Physical Activity: Not on file  Stress: Not on file  Social Connections: Not on file  Intimate Partner Violence: Not on file    History reviewed. No pertinent surgical history.  Family History  Problem Relation Age of Onset  . Diabetes Paternal Grandfather   . Hypertension Paternal Grandfather   . Diabetes Father   . Hypertension Father   . Hypertension Maternal Grandmother     Allergies  Allergen Reactions  . Other     FRESH FRUIT    Current Outpatient Medications on File Prior to Visit  Medication Sig Dispense Refill  . lisdexamfetamine (VYVANSE) 50 MG capsule Take 1 capsule (50 mg total) by mouth daily. 90 capsule 0  . methocarbamol (ROBAXIN) 500 MG tablet Take 1 tablet (500 mg total) by mouth every 8 (eight)  hours as needed for muscle spasms. 60 tablet 0  . sulfamethoxazole-trimethoprim (BACTRIM DS) 800-160 MG tablet Take 1 tablet by mouth 2 (two) times daily. 14 tablet 0  . valACYclovir (VALTREX) 500 MG tablet Take 1 tablet (500 mg total) by mouth daily. 90 tablet 3  . diclofenac (VOLTAREN) 75 MG EC tablet Take 1 tablet (75 mg total) by mouth 2 (two) times daily. (Patient not taking: Reported on 03/11/2022) 30 tablet 0  . doxycycline (MONODOX) 100 MG capsule Take 1 capsule by mouth in the morning and 1 capsule  in the evening for 10 days. Take with at least 8 ounces (large glass) of water, do not lie down for 30 minutes after. (Patient not taking: Reported on 03/11/2022) 20 capsule 0    No current facility-administered medications on file prior to visit.    BP 124/82   Pulse 67   Temp 98.4 F (36.9 C)   Ht 5\' 11"  (1.803 m)   Wt 237 lb 9.6 oz (107.8 kg)   SpO2 97%   BMI 33.14 kg/m chart      Objective:    BP 124/82   Pulse 67   Temp 98.4 F (36.9 C)   Ht 5\' 11"  (1.803 m)   Wt 237 lb 9.6 oz (107.8 kg)   SpO2 97%   BMI 33.14 kg/m  {Vitals History (Optional):23777}  Physical Exam Vitals and nursing note reviewed.  Constitutional:      Appearance: Normal appearance.  HENT:     Right Ear: Tympanic membrane, ear canal and external ear normal.     Left Ear: Tympanic membrane, ear canal and external ear normal.     Mouth/Throat:     Mouth: Mucous membranes are moist.     Pharynx: Posterior oropharyngeal erythema present.  Eyes:     Conjunctiva/sclera: Conjunctivae normal.  Cardiovascular:     Rate and Rhythm: Normal rate and regular rhythm.  Pulmonary:     Effort: Pulmonary effort is normal.     Breath sounds: Normal breath sounds. No wheezing or rhonchi.  Abdominal:     General: Abdomen is flat. Bowel sounds are normal.     Palpations: Abdomen is soft.  Musculoskeletal:        General: Normal range of motion.     Cervical back: Normal range of motion and neck supple.  Skin:    General: Skin is warm and dry.  Neurological:     Mental Status: He is alert.  Psychiatric:        Mood and Affect: Mood normal.        Behavior: Behavior normal.   No results found for any visits on 03/11/22.      Assessment & Plan:   Problem List Items Addressed This Visit   None Visit Diagnoses     Cough in adult    -  Primary   Relevant Orders   POC Influenza A&B (Binax test)   POC COVID-19   Influenza A       Relevant Medications   oseltamivir (TAMIFLU) 75 MG capsule       Meds ordered this encounter  Medications  . oseltamivir (TAMIFLU) 75 MG capsule    Sig: Take 1 capsule (75 mg total) by mouth 2 (two) times daily.    Dispense:  10 capsule     Refill:  0  . promethazine-dextromethorphan (PROMETHAZINE-DM) 6.25-15 MG/5ML syrup    Sig: Take 5 mLs by mouth 4 (four) times daily as needed.    Dispense:  118 mL    Refill:  0   Call the office if symptoms worsen or persist. Recheck as scheduled and sooner as needed.  No follow-ups on file.  Kennyth Arnold, FNP

## 2022-03-21 ENCOUNTER — Emergency Department (HOSPITAL_BASED_OUTPATIENT_CLINIC_OR_DEPARTMENT_OTHER): Payer: BC Managed Care – PPO

## 2022-03-21 ENCOUNTER — Other Ambulatory Visit: Payer: Self-pay

## 2022-03-21 ENCOUNTER — Encounter (HOSPITAL_BASED_OUTPATIENT_CLINIC_OR_DEPARTMENT_OTHER): Payer: Self-pay

## 2022-03-21 ENCOUNTER — Emergency Department (HOSPITAL_BASED_OUTPATIENT_CLINIC_OR_DEPARTMENT_OTHER)
Admission: EM | Admit: 2022-03-21 | Discharge: 2022-03-22 | Disposition: A | Discharge status: Home / Self Care | Payer: BC Managed Care – PPO | Attending: Emergency Medicine | Admitting: Emergency Medicine

## 2022-03-21 DIAGNOSIS — M7989 Other specified soft tissue disorders: Secondary | ICD-10-CM | POA: Diagnosis not present

## 2022-03-21 DIAGNOSIS — L03115 Cellulitis of right lower limb: Secondary | ICD-10-CM | POA: Insufficient documentation

## 2022-03-21 DIAGNOSIS — M79671 Pain in right foot: Secondary | ICD-10-CM | POA: Diagnosis not present

## 2022-03-21 NOTE — ED Triage Notes (Signed)
Pt presents with red and swollen pain to fifth digit on the L foot Erythema extends to mid foot with swelling. Pt denies injury.

## 2022-03-22 MED ORDER — DOXYCYCLINE HYCLATE 100 MG PO TABS
100.0000 mg | ORAL_TABLET | Freq: Once | ORAL | Status: AC
Start: 2022-03-22 — End: 2022-03-22
  Administered 2022-03-22: 100 mg via ORAL
  Filled 2022-03-22: qty 1

## 2022-03-22 MED ORDER — DOXYCYCLINE HYCLATE 100 MG PO CAPS: 100.0000 mg | ORAL_CAPSULE | Freq: Two times a day (BID) | ORAL | 0 refills | Status: AC

## 2022-03-22 NOTE — ED Provider Notes (Signed)
[facility] EMERGENCY DEPT  Provider Note  CSN: [id number] Arrival date & time: 03/21/22 2217  History Chief Complaint  Patient presents with   Foot Pain    Juan Perkins is a 26 y.o. male with no significant PMH reports he has had several days of R foot pain/swelling and redness. No known injuries. No fevers. Not a diabetic. Pain is worse with walking, localized to the base of the 5th toe.    Home Medications Prior to Admission medications   Medication Sig Start Date End Date Taking? Authorizing Provider  doxycycline (VIBRAMYCIN) 100 MG capsule Take 1 capsule (100 mg total) by mouth 2 (two) times daily for 10 days. 03/22/22 04/01/22 Yes Truddie Hidden, MD  lisdexamfetamine (VYVANSE) 50 MG capsule Take 1 capsule (50 mg total) by mouth daily. 10/22/21   Maximiano Coss, NP  methocarbamol (ROBAXIN) 500 MG tablet Take 1 tablet (500 mg total) by mouth every 8 (eight) hours as needed for muscle spasms. 06/11/21   Maximiano Coss, NP  valACYclovir (VALTREX) 500 MG tablet Take 1 tablet (500 mg total) by mouth daily. 06/11/21   Maximiano Coss, NP     Allergies    Other   Review of Systems   Review of Systems Please see HPI for pertinent positives and negatives  Physical Exam BP (!) 145/88   Pulse 83   Temp 98.2 F (36.8 C) (Oral)   Resp 18   Ht 5\' 11"  (1.803 m)   Wt 107.5 kg   SpO2 100%   BMI 33.05 kg/m   Physical Exam Vitals and nursing note reviewed.  Constitutional:      Appearance: Normal appearance.  HENT:     Head: Normocephalic and atraumatic.     Nose: Nose normal.     Mouth/Throat:     Mouth: Mucous membranes are moist.  Eyes:     Extraocular Movements: Extraocular movements intact.     Conjunctiva/sclera: Conjunctivae normal.  Cardiovascular:     Rate and Rhythm: Normal rate.  Pulmonary:     Effort: Pulmonary effort is normal.     Breath sounds: Normal breath sounds.  Abdominal:     General: Abdomen is flat.     Palpations:  Abdomen is soft.     Tenderness: There is no abdominal tenderness.  Musculoskeletal:        General: Swelling and tenderness (R midfoot, no tenderness over the distal 5th toe) present. Normal range of motion.     Cervical back: Neck supple.     Comments: Erythema and warmth of foot  Skin:    General: Skin is warm and dry.  Neurological:     General: No focal deficit present.     Mental Status: He is alert.  Psychiatric:        Mood and Affect: Mood normal.     ED Results / Procedures / Treatments   EKG None  Procedures Procedures  Medications Ordered in the ED Medications  doxycycline (VIBRA-TABS) tablet 100 mg (has no administration in time range)    Initial Impression and Plan  Patient here for R foot pain and swelling. His exam is most consistent with cellulitis. I personally viewed the images from radiology studies and agree with radiologist interpretation: Xray done in triage interpreted with fracture of distal 5th digit although has has had no injury and is not tender there. Plan treatment of cellulitis with doxycycline. Advised to stay off his foot as much as possible. He states he cannot take time  off work. He declines pain medications here. Recommend Motrin/APAP at home.    ED Course       MDM Rules/Calculators/A&P Medical Decision Making Problems Addressed: Cellulitis of right foot: acute illness or injury  Amount and/or Complexity of Data Reviewed Radiology: ordered and independent interpretation performed. Decision-making details documented in ED Course.  Risk OTC drugs. Prescription drug management.    Final Clinical Impression(s) / ED Diagnoses Final diagnoses:  Cellulitis of right foot    Rx / DC Orders ED Discharge Orders          Ordered    doxycycline (VIBRAMYCIN) 100 MG capsule  2 times daily        03/22/22 0127             Pollyann Savoy, MD 03/22/22 0127

## 2022-05-07 ENCOUNTER — Encounter: Payer: BC Managed Care – PPO | Admitting: Family Medicine

## 2022-07-15 ENCOUNTER — Other Ambulatory Visit (HOSPITAL_COMMUNITY): Payer: Self-pay

## 2022-07-15 ENCOUNTER — Ambulatory Visit (INDEPENDENT_AMBULATORY_CARE_PROVIDER_SITE_OTHER)
Admission: RE | Admit: 2022-07-15 | Discharge: 2022-07-15 | Disposition: A | Discharge status: Home / Self Care | Payer: BC Managed Care – PPO | Source: Ambulatory Visit | Attending: Internal Medicine | Admitting: Internal Medicine

## 2022-07-15 ENCOUNTER — Encounter: Payer: Self-pay | Admitting: Internal Medicine

## 2022-07-15 ENCOUNTER — Ambulatory Visit (INDEPENDENT_AMBULATORY_CARE_PROVIDER_SITE_OTHER): Payer: BC Managed Care – PPO | Admitting: Internal Medicine

## 2022-07-15 VITALS — BP 122/90 | HR 62 | Temp 97.6°F | Ht 72.0 in | Wt 243.6 lb

## 2022-07-15 DIAGNOSIS — E669 Obesity, unspecified: Secondary | ICD-10-CM

## 2022-07-15 DIAGNOSIS — R7989 Other specified abnormal findings of blood chemistry: Secondary | ICD-10-CM

## 2022-07-15 DIAGNOSIS — F900 Attention-deficit hyperactivity disorder, predominantly inattentive type: Secondary | ICD-10-CM

## 2022-07-15 DIAGNOSIS — G8929 Other chronic pain: Secondary | ICD-10-CM

## 2022-07-15 DIAGNOSIS — K219 Gastro-esophageal reflux disease without esophagitis: Secondary | ICD-10-CM | POA: Diagnosis not present

## 2022-07-15 DIAGNOSIS — D72819 Decreased white blood cell count, unspecified: Secondary | ICD-10-CM

## 2022-07-15 DIAGNOSIS — F909 Attention-deficit hyperactivity disorder, unspecified type: Secondary | ICD-10-CM | POA: Insufficient documentation

## 2022-07-15 DIAGNOSIS — E663 Overweight: Secondary | ICD-10-CM

## 2022-07-15 DIAGNOSIS — L089 Local infection of the skin and subcutaneous tissue, unspecified: Secondary | ICD-10-CM

## 2022-07-15 DIAGNOSIS — Z119 Encounter for screening for infectious and parasitic diseases, unspecified: Secondary | ICD-10-CM

## 2022-07-15 DIAGNOSIS — K9049 Malabsorption due to intolerance, not elsewhere classified: Secondary | ICD-10-CM

## 2022-07-15 DIAGNOSIS — I872 Venous insufficiency (chronic) (peripheral): Secondary | ICD-10-CM

## 2022-07-15 DIAGNOSIS — R195 Other fecal abnormalities: Secondary | ICD-10-CM

## 2022-07-15 DIAGNOSIS — D7589 Other specified diseases of blood and blood-forming organs: Secondary | ICD-10-CM

## 2022-07-15 DIAGNOSIS — M79675 Pain in left toe(s): Secondary | ICD-10-CM

## 2022-07-15 HISTORY — DX: Gastro-esophageal reflux disease without esophagitis: K21.9

## 2022-07-15 LAB — LIPID PANEL
Cholesterol: 176 mg/dL (ref 0–200)
HDL: 40.8 mg/dL (ref >39.00)
LDL Cholesterol: 123 mg/dL — ABNORMAL HIGH (ref 0–99)
NonHDL: 134.98
Total CHOL/HDL Ratio: 4
Triglycerides: 58 mg/dL (ref 0.0–149.0)
VLDL: 11.6 mg/dL (ref 0.0–40.0)

## 2022-07-15 LAB — CBC
HCT: 44.6 % (ref 39.0–52.0)
Hemoglobin: 15.5 g/dL (ref 13.0–17.0)
MCHC: 34.8 g/dL (ref 30.0–36.0)
MCV: 100.7 fl — ABNORMAL HIGH (ref 78.0–100.0)
Platelets: 183 10*3/uL (ref 150.0–400.0)
RBC: 4.42 Mil/uL (ref 4.22–5.81)
RDW: 12.3 % (ref 11.5–15.5)
WBC: 3.7 10*3/uL — ABNORMAL LOW (ref 4.0–10.5)

## 2022-07-15 LAB — SEDIMENTATION RATE: Sed Rate: 11 mm/hr (ref 0–15)

## 2022-07-15 LAB — COMPREHENSIVE METABOLIC PANEL
ALT: 139 U/L — ABNORMAL HIGH (ref 0–53)
AST: 51 U/L — ABNORMAL HIGH (ref 0–37)
Albumin: 4.4 g/dL (ref 3.5–5.2)
Alkaline Phosphatase: 70 U/L (ref 39–117)
BUN: 11 mg/dL (ref 6–23)
CO2: 29 mEq/L (ref 19–32)
Calcium: 9.2 mg/dL (ref 8.4–10.5)
Chloride: 104 mEq/L (ref 96–112)
Creatinine, Ser: 0.99 mg/dL (ref 0.40–1.50)
GFR: 103.2 mL/min (ref >60.00)
Glucose, Bld: 87 mg/dL (ref 70–99)
Potassium: 4.2 mEq/L (ref 3.5–5.1)
Sodium: 139 mEq/L (ref 135–145)
Total Bilirubin: 0.4 mg/dL (ref 0.2–1.2)
Total Protein: 7 g/dL (ref 6.0–8.3)

## 2022-07-15 LAB — C-REACTIVE PROTEIN: CRP: <1 mg/dL (ref 0.5–20.0)

## 2022-07-15 MED ORDER — PEPCID COMPLETE 10-800-165 MG PO CHEW
1.0000 | CHEWABLE_TABLET | Freq: Every day | ORAL | 11 refills | Status: AC | PRN
  Filled 2022-07-15: qty 100, 100d supply, fill #0

## 2022-07-15 NOTE — Patient Instructions (Addendum)
It was a pleasure seeing you today!  Your health and satisfaction are my top priorities. If you believe your experience today was worthy of a 5-star rating, I'd be grateful for your feedback! Lula Olszewski, MD   Next Steps:  most important is getting xray foot, and bloodwork follow up and podiatry follow up.  Gluten free diet trial secondary.  Only do stool test if gluten trial fails Schedule Follow-Up:  If any of your medical issues become urgent or worsen, please don't hesitate to reach out or seek emergency room care. In the meantime, I strongly encourage scheduling routine follow up appointments prior to leaving or calling [phone] if you don't have one scheduled yet.  We recommend your next follow-up appointment no later than Return in about 1 month (around 08/14/2022) for weight loss med management, chronic disease monitoring and management may adhd diarrhea.  Please return sooner if you are not doing well.  Preventive Care:  Don't forget to schedule your annual preventive care visit!  This important checkup is typically covered by insurance and helps identify potential health issues early.  Typically its 100% insurance covered with no co-pay and helps to get surveillance labwork paid for.  Lab & X-ray Appointments:  Scheduled any incomplete lab tests today or call us to schedule.  XRays can be done without an appointment at [facility] ([address], [address]), M-F 8:30am-noon or 1pm-5pm.  Just tell them you're there for X-rays ordered by Dr. Jon Billings.  We'll receive the results and contact you by phone or MyChart to discuss next steps.  Medical Information Release:  If you have any relevant medical information we don't have, please sign a release form so we can obtain it for your records.  Bring to Your Next Appointment: Medications: Please bring all your medication bottles to your next appointment to ensure we have an accurate record of your prescriptions. Health  Diaries: If you're monitoring any health conditions at home, keeping a diary of your readings can be very helpful for discussions at your next appointment.  Please Review your early draft clinical notes below and the final encounter summary tomorrow on MyChart after its been completed.   Screening examination for infectious disease  Loose bowel movement Assessment & Plan: Advised patient the antibody test is expensive for celiac but do suspect(s) it- he will a diet trial first. Will use gastrointestinal referral as backup if that fails     Patients chart review and interview were used to generate a prompt for artificial intelligence analysis ([employer] artificial intelligence) clinical decision support.  AI output was reviewed and is provided in red: Comprehensive Review of the Case: A 27 year old male presents with a history of frequent, uncomfortable bowel movements characterized by loose stools, occurring 1-3 times weekly over the past 5+ years. These episodes are seemingly triggered by any food intake, with a noted exacerbation with fatty foods, despite a lactose-free diet attempt which did not alleviate symptoms. The patient reports occasional sharp rectal pain, heartburn, but no nausea, abdominal pain, or blood in the stool. There has been no significant weight loss; in fact, there has been weight gain. The patient has not tried a gluten-free diet aggressively. His medical history includes ADHD, anxiety, asthma, chronic toe pain, and GERD. He is overweight with a history of chronic knee pain, has experienced malaise and fatigue, right groin discomfort, and chronic pain of the toe. Current medications include lisdexamfetamine, methocarbamol, and valacyclovir. Laboratory findings reveal a slightly elevated  creatinine (1.14), ALT (92), and a marginally high MCV (102.1), with a normal hemoglobin (15.4) and white blood cell count (3.0). His blood pressure is slightly elevated at 122/90, with a  normal pulse (62) and oxygen saturation (97%). The patient's height is 6' (1.829 m) and weight is 243 lb 9.6 oz (110.5 kg).  Clinical Problem Representation: A 27 year old male with a long-standing history of frequent, uncomfortable, and loose bowel movements without significant weight loss, exacerbated by any food intake especially fatty foods, despite a lactose-free diet. The patient has a background of ADHD, anxiety, asthma, chronic pain, and GERD, presenting with occasional sharp rectal pain and heartburn. Laboratory findings indicate slightly elevated creatinine and ALT levels, with a marginally high MCV. The patient's symptoms have persisted over 5+ years, with no significant improvement from dietary modifications.   Most Likely Diagnoses: - Irritable Bowel Syndrome (IBS): The patient's chronic symptoms of loose bowel movements without an identifiable trigger, alongside abdominal discomfort and the absence of alarming features such as significant weight loss or blood in stools, align with the Rome IV criteria for IBS. The episodic nature of his symptoms, exacerbated by food intake, and the lack of response to a lactose-free diet further support this diagnosis. The presence of anxiety and GERD also correlates with a higher prevalence of IBS in individuals with these comorbid conditions.    - Non-Celiac Gluten Sensitivity (NCGS): Although the patient has not aggressively tried a gluten-free diet, his symptoms could potentially be attributed to NCGS, given the exacerbation by food intake and partial overlap with IBS symptoms. NCGS is characterized by gastrointestinal and extraintestinal symptoms related to gluten ingestion in the absence of celiac disease and wheat allergy. The patient's failure to respond to a lactose-free diet and the presence of symptoms with various foods might suggest a broader food sensitivity, such as NCGS.    - Functional Dyspepsia (FD): The patient's symptoms of heartburn,  alongside episodic abdominal discomfort without a clear structural cause, could suggest FD. This condition is characterized by postprandial fullness, early satiation, or epigastric pain/burning not exclusively related to defecation patterns, which could be misinterpreted as or coexist with IBS. The chronicity of symptoms and association with meals, without significant alleviation from dietary changes, supports this diagnosis.   Expanded Differential Diagnoses: - Exocrine Pancreatic Insufficiency (EPI): The patient's symptoms of loose stools, particularly after fatty meals, could indicate EPI, where the pancreas fails to produce enough digestive enzymes. This condition can lead to malabsorption and gastrointestinal symptoms similar to those described. However, the absence of significant weight loss and normal glucose levels might argue against this diagnosis. Further evaluation with fecal elastase could help in confirming or ruling out EPI.    - Bile Acid Malabsorption (BAM): BAM could explain the patient's chronic diarrhea, especially postprandial and triggered by fatty foods, due to the malabsorption of bile acids leading to increased colonic motility and secretory diarrhea. The condition is often underdiagnosed and could coexist with IBS. A SeHCAT test could be diagnostic in this case.    - Lactose Intolerance with Additional Food Sensitivities: Despite the patient's claim of lactose intolerance and lack of improvement on a lactose-free diet, it's possible that multiple food sensitivities, including but not limited to lactose, contribute to his symptoms. An extensive dietary and symptom diary, followed by an elimination diet under the guidance of a dietitian, might uncover specific triggers not previously identified.   Can't Miss Differential Diagnosis: Given the chronic nature of the patient's symptoms and the lack of alarming features such  as significant weight loss, anemia, or blood in stools, there  are no immediate life-threatening conditions evident from the provided clinical scenario. However, it's crucial to rule out: - Celiac Disease: Despite the absence of a trial of a gluten-free diet, celiac disease remains a critical condition to exclude given its potential for long-term complications if untreated. Serologic testing for tissue transglutaminase antibodies (tTG-IgA) should be considered.    - Inflammatory Bowel Disease (IBD): Although less likely due to the absence of blood in stools, significant weight loss, and extraintestinal manifestations, IBD (such as Crohn's disease or ulcerative colitis) should not be missed. Fecal calprotectin and colonoscopy could be diagnostic.    - Colorectal Cancer: Given the patient's age and the chronicity of symptoms without alarming features, colorectal cancer is less likely but should always be considered in the differential diagnosis of chronic gastrointestinal symptoms. A colonoscopy would be prudent to rule out this possibility, especially if there's a change in symptoms or if alarm features develop.   Assessment  A 27 year old male presents with a chronic history of frequent, loose bowel movements, discomfort, and occasional sharp rectal pain, exacerbated by the consumption of any food, particularly fatty or greasy foods. Despite a lactose-free diet, symptoms persist without significant relief. The patient's history is notable for ADHD, anxiety, asthma, chronic toe pain, GERD, and overweight status. Current medications include lisdexamfetamine, methocarbamol, and valACYclovir. Laboratory findings are within normal limits except for slightly elevated ALT. The patient's clinical presentation, history, and laboratory findings suggest a multifactorial gastrointestinal disorder with possible contributions from dietary intolerances, medication side effects, and underlying gastrointestinal pathology.  Differential Diagnosis (DDx): 1. Irritable Bowel Syndrome  (IBS): Characterized by abdominal discomfort and altered bowel habits without organic cause; symptoms align closely with the patient's presentation. 2. Non-Celiac Gluten Sensitivity (NCGS): Symptoms similar to those of celiac disease but without the autoimmune response to gluten; not yet ruled out in this patient. 3. Exocrine Pancreatic Insufficiency (EPI): Could explain chronic diarrhea and malabsorption symptoms; often underdiagnosed. 4. Bile Acid Malabsorption (BAM): Can cause chronic diarrhea, especially after consumption of fatty foods. 5. Medication Side Effects: Lisdexamfetamine can cause gastrointestinal symptoms, including diarrhea and abdominal pain. 6. Gastroesophageal Reflux Disease (GERD) with possible Dyspepsia: Already diagnosed, could contribute to discomfort and heartburn.   Plan  For each active problem identified in the comprehensive assessment:  1. Chronic Diarrhea and Abdominal Discomfort: - Dx:   - Trial of a gluten-free diet for potential NCGS.   - Fecal elastase-1 test to evaluate for EPI.   - SeHCAT test for BAM if available, or trial of bile acid sequestrants.   - Re-evaluation of medication side effects, particularly lisdexamfetamine. - Tx:   - Dietary modification including a trial gluten-free diet.   - Pancreatic enzyme replacement therapy if EPI is confirmed.   - Bile acid sequestrants if BAM is diagnosed.   - Consideration of alternative ADHD medication if lisdexamfetamine is implicated.    Monitoring and Follow-Up: - Regular follow-up appointments to monitor response to dietary changes, medication adjustments, and any new interventions. - Referral to gastroenterology for further evaluation if symptoms persist or worsen, including consideration for endoscopic evaluation. - Referral to a dietitian for personalized dietary counseling and support in implementing a gluten-free diet if pursued.  This plan aims to address the patient's symptoms comprehensively,  with a focus on both diagnostic clarification and symptom management, while also considering the potential impact of medications and the need for specialized referrals.   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  He reports he took the doxycycline consistently so I suspect it is a resistant bacteria in the region.  He is only prediabetic so I strongly doubt he has developed osteomyelitis however with the ration of this infection it needs to be ruled out.  Therefore we are going to hold off on taking an extra round of antibiotics and were going to repeat the x-ray to look for infection of the bone first.  I am going to send to a foot specialist if the x-ray of the the bone or today's blood work suggest osteomyelitis prior to antibiotics and I explained that the reason for this is that giving additional antibiotics at this time will not necessarily work but it will make it much harder for [place] to get a tissue sample that allows [place] to confirm the infection is in the bone so that we can choose an antibiotic that completely eradicate.  Another way of saying this is that antibiotics to try to eradicate the infection now actually increased the risk of ultimately losing the toe.   Overweight  Gastroesophageal reflux disease without esophagitis  Attention deficit hyperactivity disorder (ADHD), predominantly inattentive type Assessment & Plan: I vies the patient that I be happy to take over prescribing of this medication if he wants it but it is scheduled to controlled substance so will require appointments every 3 months as per [facility] regulations.  If you would like we can discuss starting it next month with a contract.  I encouraged him to call local pharmacies because the problem has changed over the  last few months where the issue is more to do with difficulty with obtaining the medicine due to back orders then price.   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 Complex Concerns: If your concern is more complex, scheduling an appointment might be best. Discuss this with the staff to find the most suitable option. Lab & Imaging Results: We'll contact you directly if results are abnormal or you don't use MyChart. Most normal results will be on MyChart within 2-3 business days, with a review message from Dr. Jon Billings. Haven't heard back in 2 weeks? Need results sooner? Contact us at [phone]. Referrals: Our referral coordinator will manage specialist referrals. The specialist's office should contact you within 2 weeks to schedule an appointment. Call us if you haven't heard from them after 2 weeks.  Staying Connected:  MyChart: Activate your MyChart for the fastest way to access results and message [place]. See the last page of this paperwork for instructions.  Billing: X-ray & Lab Orders: These are billed by separate companies. Contact the invoicing company directly for questions or concerns. Visit Charges: Discuss any billing inquiries with our administrative services team.  Feedback & Satisfaction: Share Your Experience: We strive for your satisfaction! If you have any complaints, please let Dr. Jon Billings know directly or contact our Practice Administrators, Edwena Felty or [employer], by asking at the front desk.  Scheduling Tips: Shorter Wait Times: 8 am and 1 pm appointments often have the quickest wait times. Longer Appointments: If you need more time during your visit,  talk to the front desk. Due to insurance regulations, multiple back-to-back appointments might be necessary.

## 2022-07-15 NOTE — Assessment & Plan Note (Addendum)
Advised patient the antibody test is expensive for celiac but do suspect(s) it- he will a diet trial first. Will use gastrointestinal referral as backup if that fails     Patients chart review and interview were used to generate a prompt for artificial intelligence analysis ([employer] artificial intelligence) clinical decision support.  AI output was reviewed and is provided in red: Comprehensive Review of the Case: A 27 year old male presents with a history of frequent, uncomfortable bowel movements characterized by loose stools, occurring 1-3 times weekly over the past 5+ years. These episodes are seemingly triggered by any food intake, with a noted exacerbation with fatty foods, despite a lactose-free diet attempt which did not alleviate symptoms. The patient reports occasional sharp rectal pain, heartburn, but no nausea, abdominal pain, or blood in the stool. There has been no significant weight loss; in fact, there has been weight gain. The patient has not tried a gluten-free diet aggressively. His medical history includes ADHD, anxiety, asthma, chronic toe pain, and GERD. He is overweight with a history of chronic knee pain, has experienced malaise and fatigue, right groin discomfort, and chronic pain of the toe. Current medications include lisdexamfetamine, methocarbamol, and valacyclovir. Laboratory findings reveal a slightly elevated creatinine (1.14), ALT (92), and a marginally high MCV (102.1), with a normal hemoglobin (15.4) and white blood cell count (3.0). His blood pressure is slightly elevated at 122/90, with a normal pulse (62) and oxygen saturation (97%). The patient's height is 6' (1.829 m) and weight is 243 lb 9.6 oz (110.5 kg).  Clinical Problem Representation: A 27 year old male with a long-standing history of frequent, uncomfortable, and loose bowel movements without significant weight loss, exacerbated by any food intake especially fatty foods, despite a lactose-free diet. The  patient has a background of ADHD, anxiety, asthma, chronic pain, and GERD, presenting with occasional sharp rectal pain and heartburn. Laboratory findings indicate slightly elevated creatinine and ALT levels, with a marginally high MCV. The patient's symptoms have persisted over 5+ years, with no significant improvement from dietary modifications.   Most Likely Diagnoses: - Irritable Bowel Syndrome (IBS): The patient's chronic symptoms of loose bowel movements without an identifiable trigger, alongside abdominal discomfort and the absence of alarming features such as significant weight loss or blood in stools, align with the Rome IV criteria for IBS. The episodic nature of his symptoms, exacerbated by food intake, and the lack of response to a lactose-free diet further support this diagnosis. The presence of anxiety and GERD also correlates with a higher prevalence of IBS in individuals with these comorbid conditions.    - Non-Celiac Gluten Sensitivity (NCGS): Although the patient has not aggressively tried a gluten-free diet, his symptoms could potentially be attributed to NCGS, given the exacerbation by food intake and partial overlap with IBS symptoms. NCGS is characterized by gastrointestinal and extraintestinal symptoms related to gluten ingestion in the absence of celiac disease and wheat allergy. The patient's failure to respond to a lactose-free diet and the presence of symptoms with various foods might suggest a broader food sensitivity, such as NCGS.    - Functional Dyspepsia (FD): The patient's symptoms of heartburn, alongside episodic abdominal discomfort without a clear structural cause, could suggest FD. This condition is characterized by postprandial fullness, early satiation, or epigastric pain/burning not exclusively related to defecation patterns, which could be misinterpreted as or coexist with IBS. The chronicity of symptoms and association with meals, without significant alleviation from  dietary changes, supports this diagnosis.   Expanded Differential Diagnoses: - Exocrine  Pancreatic Insufficiency (EPI): The patient's symptoms of loose stools, particularly after fatty meals, could indicate EPI, where the pancreas fails to produce enough digestive enzymes. This condition can lead to malabsorption and gastrointestinal symptoms similar to those described. However, the absence of significant weight loss and normal glucose levels might argue against this diagnosis. Further evaluation with fecal elastase could help in confirming or ruling out EPI.    - Bile Acid Malabsorption (BAM): BAM could explain the patient's chronic diarrhea, especially postprandial and triggered by fatty foods, due to the malabsorption of bile acids leading to increased colonic motility and secretory diarrhea. The condition is often underdiagnosed and could coexist with IBS. A SeHCAT test could be diagnostic in this case.    - Lactose Intolerance with Additional Food Sensitivities: Despite the patient's claim of lactose intolerance and lack of improvement on a lactose-free diet, it's possible that multiple food sensitivities, including but not limited to lactose, contribute to his symptoms. An extensive dietary and symptom diary, followed by an elimination diet under the guidance of a dietitian, might uncover specific triggers not previously identified.   Can't Miss Differential Diagnosis: Given the chronic nature of the patient's symptoms and the lack of alarming features such as significant weight loss, anemia, or blood in stools, there are no immediate life-threatening conditions evident from the provided clinical scenario. However, it's crucial to rule out: - Celiac Disease: Despite the absence of a trial of a gluten-free diet, celiac disease remains a critical condition to exclude given its potential for long-term complications if untreated. Serologic testing for tissue transglutaminase antibodies (tTG-IgA) should  be considered.    - Inflammatory Bowel Disease (IBD): Although less likely due to the absence of blood in stools, significant weight loss, and extraintestinal manifestations, IBD (such as Crohn's disease or ulcerative colitis) should not be missed. Fecal calprotectin and colonoscopy could be diagnostic.    - Colorectal Cancer: Given the patient's age and the chronicity of symptoms without alarming features, colorectal cancer is less likely but should always be considered in the differential diagnosis of chronic gastrointestinal symptoms. A colonoscopy would be prudent to rule out this possibility, especially if there's a change in symptoms or if alarm features develop.   Assessment  A 27 year old male presents with a chronic history of frequent, loose bowel movements, discomfort, and occasional sharp rectal pain, exacerbated by the consumption of any food, particularly fatty or greasy foods. Despite a lactose-free diet, symptoms persist without significant relief. The patient's history is notable for ADHD, anxiety, asthma, chronic toe pain, GERD, and overweight status. Current medications include lisdexamfetamine, methocarbamol, and valACYclovir. Laboratory findings are within normal limits except for slightly elevated ALT. The patient's clinical presentation, history, and laboratory findings suggest a multifactorial gastrointestinal disorder with possible contributions from dietary intolerances, medication side effects, and underlying gastrointestinal pathology.  Differential Diagnosis (DDx): 1. Irritable Bowel Syndrome (IBS): Characterized by abdominal discomfort and altered bowel habits without organic cause; symptoms align closely with the patient's presentation. 2. Non-Celiac Gluten Sensitivity (NCGS): Symptoms similar to those of celiac disease but without the autoimmune response to gluten; not yet ruled out in this patient. 3. Exocrine Pancreatic Insufficiency (EPI): Could explain chronic  diarrhea and malabsorption symptoms; often underdiagnosed. 4. Bile Acid Malabsorption (BAM): Can cause chronic diarrhea, especially after consumption of fatty foods. 5. Medication Side Effects: Lisdexamfetamine can cause gastrointestinal symptoms, including diarrhea and abdominal pain. 6. Gastroesophageal Reflux Disease (GERD) with possible Dyspepsia: Already diagnosed, could contribute to discomfort and heartburn.   Plan  For  each active problem identified in the comprehensive assessment:  1. Chronic Diarrhea and Abdominal Discomfort: - Dx:   - Trial of a gluten-free diet for potential NCGS.   - Fecal elastase-1 test to evaluate for EPI.   - SeHCAT test for BAM if available, or trial of bile acid sequestrants.   - Re-evaluation of medication side effects, particularly lisdexamfetamine. - Tx:   - Dietary modification including a trial gluten-free diet.   - Pancreatic enzyme replacement therapy if EPI is confirmed.   - Bile acid sequestrants if BAM is diagnosed.   - Consideration of alternative ADHD medication if lisdexamfetamine is implicated.    Monitoring and Follow-Up: - Regular follow-up appointments to monitor response to dietary changes, medication adjustments, and any new interventions. - Referral to gastroenterology for further evaluation if symptoms persist or worsen, including consideration for endoscopic evaluation. - Referral to a dietitian for personalized dietary counseling and support in implementing a gluten-free diet if pursued.  This plan aims to address the patient's symptoms comprehensively, with a focus on both diagnostic clarification and symptom management, while also considering the potential impact of medications and the need for specialized referrals.

## 2022-07-15 NOTE — Assessment & Plan Note (Addendum)
Advised patient  that I would be happy to take over prescribing of this medication if he wants it but it is scheduled to controlled substance so will require appointments every 3 months as per [facility] regulations.  He expressed preference to hold off due to price concerns and appointment requirements/costs.  I offered to discuss starting  next month in follow up but explained it would require appointment with controlled substance(s) contract to begin..  I encouraged him to call local pharmacies because the problem has changed over the last few months where the issue is more to do with difficulty with obtaining the medicine due to back orders then price.  I offered Wellbutrin which is cheap and full year supply can be given and he agreed to that.

## 2022-07-15 NOTE — Progress Notes (Addendum)
[facility]  Phone: [phone]  New patient visit  Visit Date: 07/15/2022 Patient: Juan Perkins   DOB: 05/03/1995   27 y.o. Male  MRN: [id number] PCP:  Lula Olszewski, MD  (establishing care today)  Today's Health Care Provider: Lula Olszewski, MD   Assessment and Plan:   This initial visit focused on establishing a primary care relationship and gathering a comprehensive medical history. We addressed key concerns and prioritized next steps. For any remaining concerns, we encourage Mr.Cantara to schedule a follow-up appointment at his earliest convenience.   To further complete the medical history and optimize health maintenance, we also recommend scheduling an Annual Comprehensive Physical Exam (CPE) also known as Preventive Medicine Visit.  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  He reports he took the doxycycline consistently so I suspect it is a resistant bacteria in the region.  He is only prediabetic so I strongly doubt he has developed osteomyelitis however with the duration of this infection it needs to be ruled out.  Therefore we are going to hold off on taking an extra round of antibiotics and were going to repeat the x-ray to look for infection of the bone first along with esr/crp. I am going to send to a foot specialist if the x-ray of the the bone or today's blood work suggest osteomyelitis prior to antibiotics and I explained that the reason for this is that giving additional antibiotics at this time will not necessarily work but it will make it much harder for [place] to get a tissue sample that allows [place] to confirm the infection is in the bone so that we can choose an antibiotic that completely eradicate.  Another way of saying this is that  antibiotics to try to eradicate the infection now actually increased the risk of ultimately losing the toe.   After labwork and XR returned same day today I reviewed the XR myself and it seemed negative for periosteal reaction. Esr and crp negative.  I ran the case past artificial intelligence to decide about empiric antibiotic(s) after this data and agree with its recommendations as follows:  Given the chronic nature of the toe pain, the presence of malodorous drainage, and the lack of improvement with topical antibiotics, a bacterial infection such as cellulitis is a consideration, especially in the context of skin splitting and tenderness  . However, the chronicity and specific symptoms also raise the possibility of underlying conditions such as a fungal infection or dermatological conditions like psoriasis or eczema, which can predispose to secondary bacterial infection. The negative X-ray and laboratory inflammatory markers (CRP, ESR) suggest that there is no acute bone involvement or systemic inflammatory response, but do not rule out a localized infection or chronic condition  . The systemic symptoms mentioned, including frequent loose stools associated with eating greasy foods, may indicate a separate systemic issue such as a gastrointestinal disorder, which may not be directly related but could impact overall health and recovery. Given these considerations, the approach should include: Further testing to identify the causative agent, if bacterial, fungal, or another pathogen is involved. This could include a wound culture or skin scraping for fungal culture. A podiatry evaluation to assess for structural issues, chronic wounds, and to provide specialized care  for the toe. Consideration of a dermatology consultation for evaluation of potential underlying skin conditions that could predispose to chronic infections. A review of the patient's gastrointestinal symptoms, potentially involving a  gastroenterology consultation, to address the chronic loose stools and assess for any related systemic conditions.  Orders: -     DG Foot 2 Views Left; Future -     Sedimentation rate -     C-reactive protein -     Ambulatory referral to Podiatry  Loose bowel movement Assessment & Plan: Advised patient the antibody test is expensive for celiac but do suspect(s) it- he will a diet trial first. Will use gastrointestinal referral as backup if that fails     Patients chart review and interview were used to generate a prompt for artificial intelligence analysis ([employer] artificial intelligence) clinical decision support.  AI output was reviewed and is provided in red: Comprehensive Review of the Case: A 27 year old male presents with a history of frequent, uncomfortable bowel movements characterized by loose stools, occurring 1-3 times weekly over the past 5+ years. These episodes are seemingly triggered by any food intake, with a noted exacerbation with fatty foods, despite a lactose-free diet attempt which did not alleviate symptoms. The patient reports occasional sharp rectal pain, heartburn, but no nausea, abdominal pain, or blood in the stool. There has been no significant weight loss; in fact, there has been weight gain. The patient has not tried a gluten-free diet aggressively. His medical history includes ADHD, anxiety, asthma, chronic toe pain, and GERD. He is overweight with a history of chronic knee pain, has experienced malaise and fatigue, right groin discomfort, and chronic pain of the toe. Current medications include lisdexamfetamine, methocarbamol, and valacyclovir. Laboratory findings reveal a slightly elevated creatinine (1.14), ALT (92), and a marginally high MCV (102.1), with a normal hemoglobin (15.4) and white blood cell count (3.0). His blood pressure is slightly elevated at 122/90, with a normal pulse (62) and oxygen saturation (97%). The patient's height is 6' (1.829 m) and  weight is 243 lb 9.6 oz (110.5 kg).  Clinical Problem Representation: A 27 year old male with a long-standing history of frequent, uncomfortable, and loose bowel movements without significant weight loss, exacerbated by any food intake especially fatty foods, despite a lactose-free diet. The patient has a background of ADHD, anxiety, asthma, chronic pain, and GERD, presenting with occasional sharp rectal pain and heartburn. Laboratory findings indicate slightly elevated creatinine and ALT levels, with a marginally high MCV. The patient's symptoms have persisted over 5+ years, with no significant improvement from dietary modifications.   Most Likely Diagnoses: - Irritable Bowel Syndrome (IBS): The patient's chronic symptoms of loose bowel movements without an identifiable trigger, alongside abdominal discomfort and the absence of alarming features such as significant weight loss or blood in stools, align with the Rome IV criteria for IBS. The episodic nature of his symptoms, exacerbated by food intake, and the lack of response to a lactose-free diet further support this diagnosis. The presence of anxiety and GERD also correlates with a higher prevalence of IBS in individuals with these comorbid conditions.    - Non-Celiac Gluten Sensitivity (NCGS): Although the patient has not aggressively tried a gluten-free diet, his symptoms could potentially be attributed to NCGS, given the exacerbation by food intake and partial overlap with IBS symptoms. NCGS is characterized by gastrointestinal and extraintestinal symptoms related to gluten ingestion in the absence of celiac disease and wheat allergy. The patient's failure to respond to a lactose-free diet and the presence  of symptoms with various foods might suggest a broader food sensitivity, such as NCGS.    - Functional Dyspepsia (FD): The patient's symptoms of heartburn, alongside episodic abdominal discomfort without a clear structural cause, could suggest FD.  This condition is characterized by postprandial fullness, early satiation, or epigastric pain/burning not exclusively related to defecation patterns, which could be misinterpreted as or coexist with IBS. The chronicity of symptoms and association with meals, without significant alleviation from dietary changes, supports this diagnosis.   Expanded Differential Diagnoses: - Exocrine Pancreatic Insufficiency (EPI): The patient's symptoms of loose stools, particularly after fatty meals, could indicate EPI, where the pancreas fails to produce enough digestive enzymes. This condition can lead to malabsorption and gastrointestinal symptoms similar to those described. However, the absence of significant weight loss and normal glucose levels might argue against this diagnosis. Further evaluation with fecal elastase could help in confirming or ruling out EPI.    - Bile Acid Malabsorption (BAM): BAM could explain the patient's chronic diarrhea, especially postprandial and triggered by fatty foods, due to the malabsorption of bile acids leading to increased colonic motility and secretory diarrhea. The condition is often underdiagnosed and could coexist with IBS. A SeHCAT test could be diagnostic in this case.    - Lactose Intolerance with Additional Food Sensitivities: Despite the patient's claim of lactose intolerance and lack of improvement on a lactose-free diet, it's possible that multiple food sensitivities, including but not limited to lactose, contribute to his symptoms. An extensive dietary and symptom diary, followed by an elimination diet under the guidance of a dietitian, might uncover specific triggers not previously identified.   Can't Miss Differential Diagnosis: Given the chronic nature of the patient's symptoms and the lack of alarming features such as significant weight loss, anemia, or blood in stools, there are no immediate life-threatening conditions evident from the provided clinical scenario.  However, it's crucial to rule out: - Celiac Disease: Despite the absence of a trial of a gluten-free diet, celiac disease remains a critical condition to exclude given its potential for long-term complications if untreated. Serologic testing for tissue transglutaminase antibodies (tTG-IgA) should be considered.    - Inflammatory Bowel Disease (IBD): Although less likely due to the absence of blood in stools, significant weight loss, and extraintestinal manifestations, IBD (such as Crohn's disease or ulcerative colitis) should not be missed. Fecal calprotectin and colonoscopy could be diagnostic.    - Colorectal Cancer: Given the patient's age and the chronicity of symptoms without alarming features, colorectal cancer is less likely but should always be considered in the differential diagnosis of chronic gastrointestinal symptoms. A colonoscopy would be prudent to rule out this possibility, especially if there's a change in symptoms or if alarm features develop.   Assessment  A 27 year old male presents with a chronic history of frequent, loose bowel movements, discomfort, and occasional sharp rectal pain, exacerbated by the consumption of any food, particularly fatty or greasy foods. Despite a lactose-free diet, symptoms persist without significant relief. The patient's history is notable for ADHD, anxiety, asthma, chronic toe pain, GERD, and overweight status. Current medications include lisdexamfetamine, methocarbamol, and valACYclovir. Laboratory findings are within normal limits except for slightly elevated ALT. The patient's clinical presentation, history, and laboratory findings suggest a multifactorial gastrointestinal disorder with possible contributions from dietary intolerances, medication side effects, and underlying gastrointestinal pathology.  Differential Diagnosis (DDx): 1. Irritable Bowel Syndrome (IBS): Characterized by abdominal discomfort and altered bowel habits without organic cause;  symptoms align closely with the patient's presentation.  2. Non-Celiac Gluten Sensitivity (NCGS): Symptoms similar to those of celiac disease but without the autoimmune response to gluten; not yet ruled out in this patient. 3. Exocrine Pancreatic Insufficiency (EPI): Could explain chronic diarrhea and malabsorption symptoms; often underdiagnosed. 4. Bile Acid Malabsorption (BAM): Can cause chronic diarrhea, especially after consumption of fatty foods. 5. Medication Side Effects: Lisdexamfetamine can cause gastrointestinal symptoms, including diarrhea and abdominal pain. 6. Gastroesophageal Reflux Disease (GERD) with possible Dyspepsia: Already diagnosed, could contribute to discomfort and heartburn.   Plan  For each active problem identified in the comprehensive assessment:  1. Chronic Diarrhea and Abdominal Discomfort: - Dx:   - Trial of a gluten-free diet for potential NCGS.   - Fecal elastase-1 test to evaluate for EPI.   - SeHCAT test for BAM if available, or trial of bile acid sequestrants.   - Re-evaluation of medication side effects, particularly lisdexamfetamine. - Tx:   - Dietary modification including a trial gluten-free diet.   - Pancreatic enzyme replacement therapy if EPI is confirmed.   - Bile acid sequestrants if BAM is diagnosed.   - Consideration of alternative ADHD medication if lisdexamfetamine is implicated.    Monitoring and Follow-Up: - Regular follow-up appointments to monitor response to dietary changes, medication adjustments, and any new interventions. - Referral to gastroenterology for further evaluation if symptoms persist or worsen, including consideration for endoscopic evaluation. - Referral to a dietitian for personalized dietary counseling and support in implementing a gluten-free diet if pursued.  This plan aims to address the patient's symptoms comprehensively, with a focus on both diagnostic clarification and symptom management, while also considering  the potential impact of medications and the need for specialized referrals.  Orders: -     CBC -     Comprehensive metabolic panel -     Pancreatic elastase, fecal -     Ambulatory referral to Gastroenterology  Overweight  Gastroesophageal reflux disease without esophagitis -     Pepcid Complete; Chew 1 tablet by mouth daily as needed.  Dispense: 100 tablet; Refill: 11  Attention deficit hyperactivity disorder (ADHD), predominantly inattentive type Assessment & Plan: Advised patient  that I would be happy to take over prescribing of this medication if he wants it but it is scheduled to controlled substance so will require appointments every 3 months as per [facility] regulations.  He expressed preference to hold off due to price concerns and appointment requirements/costs.  I offered to discuss starting  next month in follow up but explained it would require appointment with controlled substance(s) contract to begin..  I encouraged him to call local pharmacies because the problem has changed over the last few months where the issue is more to do with difficulty with obtaining the medicine due to back orders then price.  I offered Wellbutrin which is cheap and full year supply can be given and he agreed to that.   Obesity due to energy imbalance Assessment & Plan: I advised patient that Wellbutrin might be a good solution for a low cost approach that I could write for a whole year at a time and will still treat ADHD and weight loss a little bit  Orders: -     Lipid panel  Leukopenia, unspecified type  Venous reflux  Screening examination for infectious disease -     Hepatitis C antibody -     HIV Antibody (routine testing w rflx)  Elevated LFTs -     Ambulatory referral to Gastroenterology  Fatty food intolerance -  Ambulatory referral to Gastroenterology  Macrocytosis without anemia      Subjective:   Mr.Hillier presents today with intent to establish care with Lula Olszewski, MD as his Primary Care Provider (PCP) going forward.   His main concern(s) / chief complaint(s) are Transfer of care, Frequent bowel movements (Uncomfortable abdominal feeling. Lactose intolerant but sem to happen when eating any foods.), and Left small toe pain (Was told it was fractured at the ED around November or December of 2023. Has a odor and the skin is split underneath for the past two weeks, did hit toe on an object around that time that caused bleeding.)   HPI  Problem-oriented charting was used to develop and update his medical history: Problem  Loose Bowel Movement   Uncomfortable abdominal feeling. Lactose intolerant but sem to happen when eating any foods. Heart diarrhea possibly 1-3 times out of the week any food-possibly fatty creates this diarrhea or bowel issues no blood ever has been persistent over 5+ years no known history of colon cancer in family heartburn no nausea occasionally there is a sharp rectal pain no abdominal pain flares are more random however really greasy foods cause bowels to change besides fruit and a healthier diet including greens no other diet attempts have been made no weight loss associated if anything weight gained  Gluten free- hasn't really been aggressively tried yet Lactose free has been tried and it didn't go away.     Chronic Pain of Toe   Was told it was fractured at the ED around November or December of 2023. Has a odor and the skin is split underneath for the past two weeks, did hit toe on an object around that time that caused bleeding. Antibiotic(s) ointment didn't really do anything. Pain is associated with tenderness in the metatarsophalangeal joint  X-ray done in the emergency room was reviewed with the patient on April 16 for the December 2023 and there is no fracture present when viewed by me and there is soft tissue swelling.  I also reviewed the emergency room notes with the patient and he reports that there was a  misinterpretation of the tenderness that there was tenderness in the fifth toe at that time and that has persisted all the way till now nearly 6 months later.  It still also opening up and draining and he has pictures of that with malodorous fluids. swollen pinky toe with a punctate area of malodorous drainage and erythema.  Tenderness all around the metatarsophalangeal joint 5th on left   Gerd (Gastroesophageal Reflux Disease)   Recommend Pepcid complete as needed only    Attention Deficit Hyperactivity Disorder (Adhd)   Was on Vyvanse not sure if wants to resume price issues   Obesity Due to Energy Imbalance  Leucopenia   Lab Results  Component Value Date/Time   WBC 3.7 (L) 07/15/2022 10:24 AM   WBC 3.0 (L) 10/22/2021 11:01 AM   WBC 4.4 06/04/2021 04:01 PM   WBC 3.0 (L) 07/21/2016 12:33 PM   WBC 3.4 (L) 09/11/2015 10:17 AM   Lab Results  Component Value Date/Time   VITAMINB12 364 07/21/2016 12:33 PM   Lab Results  Component Value Date/Time   ALT 139 (H) 07/15/2022 10:24 AM   ALT 92 (H) 10/22/2021 11:01 AM   ALT 67 (H) 06/04/2021 04:01 PM   ALT 47 (H) 07/21/2016 12:33 PM   ALT 37 12/29/2012 01:35 PM   Lab Results  Component Value Date/Time   AST 51 (H)  07/15/2022 10:24 AM   AST 38 (H) 10/22/2021 11:01 AM   AST 27 06/04/2021 04:01 PM   AST 39 07/21/2016 12:33 PM   AST 22 12/29/2012 01:35 PM   Lab Results  Component Value Date/Time   ALKPHOS 70 07/15/2022 10:24 AM   ALKPHOS 67 10/22/2021 11:01 AM   ALKPHOS 68 06/04/2021 04:01 PM   ALKPHOS 60 07/21/2016 12:33 PM   ALKPHOS 80 12/29/2012 01:35 PM   HIV NON-REACTIVE (07/25 1101)      Venous Reflux   Ultrasound  Right:  - No evidence of deep vein thrombosis seen in the right lower extremity,  from the common femoral through the popliteal veins.  - No evidence of superficial venous reflux seen in the right greater  saphenous vein.  - Venous reflux is noted in the right common femoral vein.  - Venous reflux is  noted in the right short saphenous vein.  - No venous reflux is noted in the AASV     Elevated Lfts   09/2021 limited ultrasound abdomen No focal lesion identified. Within normal limits in parenchymal echogenicity. Portal vein is patent on color Doppler imaging with normal direction of blood flow towards the liver.  Lab Results  Component Value Date/Time   ALT 139 (H) 07/15/2022 10:24 AM   ALT 92 (H) 10/22/2021 11:01 AM   ALT 67 (H) 06/04/2021 04:01 PM   ALT 47 (H) 07/21/2016 12:33 PM   ALT 37 12/29/2012 01:35 PM   Lab Results  Component Value Date/Time   AST 51 (H) 07/15/2022 10:24 AM   AST 38 (H) 10/22/2021 11:01 AM   AST 27 06/04/2021 04:01 PM   AST 39 07/21/2016 12:33 PM   AST 22 12/29/2012 01:35 PM   Lab Results  Component Value Date/Time   ALKPHOS 70 07/15/2022 10:24 AM   ALKPHOS 67 10/22/2021 11:01 AM   ALKPHOS 68 06/04/2021 04:01 PM   ALKPHOS 60 07/21/2016 12:33 PM   ALKPHOS 80 12/29/2012 01:35 PM  No components found for: "BILIT" No results found for: "LABGGT"       Component Value Date/Time   BILITOT 0.4 07/15/2022 1024      Latest Ref Rng & Units 07/15/2022   10:24 AM 10/22/2021   11:01 AM 06/04/2021    4:01 PM  CBC  WBC 4.0 - 10.5 K/uL 3.7  3.0  4.4   Hemoglobin 13.0 - 17.0 g/dL 16.1  09.6  04.5   Hematocrit 39.0 - 52.0 % 44.6  45.1  43.4   Platelets 150.0 - 400.0 K/uL 183.0  160.0  163.0   Body mass index is 33.04 kg/m. Lab Results  Component Value Date   HGBA1C 5.8 10/22/2021   HGBA1C 5.7 06/04/2021   HGBA1C 4.9 07/21/2016       Macrocytosis Without Anemia   Lab Results  Component Value Date/Time   MCV 100.7 (H) 07/15/2022 10:24 AM   MCV 102.1 (H) 10/22/2021 11:01 AM   MCV 101.8 (H) 06/04/2021 04:01 PM   MCV 100.0 07/21/2016 12:33 PM   MCV 94.9 09/11/2015 10:17 AM      Overweight (Resolved)     Depression Screen    07/15/2022    9:17 AM 03/11/2022   10:58 AM 10/22/2021    9:51 AM 07/21/2016   11:08 AM  PHQ 2/9 Scores  PHQ - 2  Score 0 0 1 0  PHQ- 9 Score  3 11    Results for orders placed or performed in visit on 07/15/22  CBC  Result Value Ref Range   WBC 3.7 (L) 4.0 - 10.5 K/uL   RBC 4.42 4.22 - 5.81 Mil/uL   Platelets 183.0 150.0 - 400.0 K/uL   Hemoglobin 15.5 13.0 - 17.0 g/dL   HCT 16.1 09.6 - 04.5 %   MCV 100.7 (H) 78.0 - 100.0 fl   MCHC 34.8 30.0 - 36.0 g/dL   RDW 40.9 81.1 - 91.4 %  Comp Met (CMET)  Result Value Ref Range   Sodium 139 135 - 145 mEq/L   Potassium 4.2 3.5 - 5.1 mEq/L   Chloride 104 96 - 112 mEq/L   CO2 29 19 - 32 mEq/L   Glucose, Bld 87 70 - 99 mg/dL   BUN 11 6 - 23 mg/dL   Creatinine, Ser 7.82 0.40 - 1.50 mg/dL   Total Bilirubin 0.4 0.2 - 1.2 mg/dL   Alkaline Phosphatase 70 39 - 117 U/L   AST 51 (H) 0 - 37 U/L   ALT 139 (H) 0 - 53 U/L   Total Protein 7.0 6.0 - 8.3 g/dL   Albumin 4.4 3.5 - 5.2 g/dL   GFR 956.21 >30.86 mL/min   Calcium 9.2 8.4 - 10.5 mg/dL  Lipid panel  Result Value Ref Range   Cholesterol 176 0 - 200 mg/dL   Triglycerides 57.8 0.0 - 149.0 mg/dL   HDL 46.96 >29.52 mg/dL   VLDL 84.1 0.0 - 32.4 mg/dL   LDL Cholesterol 401 (H) 0 - 99 mg/dL   Total CHOL/HDL Ratio 4    NonHDL 134.98   Sedimentation rate  Result Value Ref Range   Sed Rate 11 0 - 15 mm/hr  C-reactive protein  Result Value Ref Range   CRP <1.0 0.5 - 20.0 mg/dL    The following were reviewed and entered/updated into his MEDICAL RECORD NUMBERPast Medical History:  Diagnosis Date   ADHD (attention deficit hyperactivity disorder) 07/14/2001   dx from [facility]now [facility] records; on Concerta, then Adderall XR to 4.08, then back to Concerta until 7.09   Anxiety    Asthma 02/13/2009   from [facility] records; one note with rx for albuterol and sample of  singulair    Attention deficit hyperactivity disorder (ADHD) 07/15/2022   Was on Vyvanse not sure if wants to resume price issues   Chronic pain of toe 07/15/2022   Was told it was fractured at  the ED around November or December of 2023. Has a odor and the skin is split underneath for the past two weeks, did hit toe on an object around that time that caused bleeding. Antibiotic(s) ointment didn't really do anything. Pain is associated with tenderness in the metatarsophalangeal joint  X-ray done in the emergency room was reviewed with the patient on April 16   GERD (gastroesophageal reflux disease)    GERD (gastroesophageal reflux disease) 07/15/2022   Recommend Pepcid complete as needed only   3. Overweight: - Dx: Assessment of current diet, physical activity level, and potential metabolic contributors. - Tx: Structured weight loss program focusing on diet, exercise, and behavioral changes. Consider referral to a dietitian.  4. Anxiety and ADHD: - Dx: Review current treatment efficacy and side effects. - Tx: Consider psychiatric consul   Leucopenia 07/15/2022   Lab Results Component Value Date/Time  WBC 3.7 (L) 07/15/2022 10:24 AM  WBC 3.0 (L) 10/22/2021 11:01 AM  WBC 4.4 06/04/2021 04:01 PM  WBC 3.0 (L) 07/21/2016 12:33 PM  WBC 3.4 (L) 09/11/2015 10:17 AM  Lab Results  Component Value Date/Time  [id number] 07/21/2016 12:33 PM  Lab Results Component Value Date/Time  ALT 139 (H) 07/15/2022 10:24 AM  ALT 92 (H) 10/22/2021 11:01 AM  ALT 6   History reviewed. No pertinent surgical history. Family History  Problem Relation Age of Onset   Diabetes Paternal Grandfather    Hypertension Paternal Grandfather    Diabetes Father    Hypertension Father    Hypertension Maternal Grandmother    Outpatient Medications Prior to Visit  Medication Sig Dispense Refill   lisdexamfetamine (VYVANSE) 50 MG capsule Take 1 capsule (50 mg total) by mouth daily. 90 capsule 0   methocarbamol (ROBAXIN) 500 MG tablet Take 1 tablet (500 mg total) by mouth every 8 (eight) hours as needed for muscle spasms. 60 tablet 0   valACYclovir (VALTREX) 500 MG tablet Take 1 tablet (500 mg total) by  mouth daily. 90 tablet 3   No facility-administered medications prior to visit.    Allergies  Allergen Reactions   Other     FRESH FRUIT   Social History   Tobacco Use   Smoking status: Every Day    Packs/day: 0.50    Years: 5.00    Additional pack years: 0.00    Total pack years: 2.50    Types: Cigarettes    Passive exposure: Current   Smokeless tobacco: Never  Vaping Use   Vaping Use: Never used  Substance Use Topics   Alcohol use: Yes    Alcohol/week: 14.0 standard drinks of alcohol    Types: 4 Cans of beer, 10 Shots of liquor per week    Comment: weekly   Drug use: Yes    Frequency: 5.0 times per week    Types: Marijuana    Immunization History  Administered Date(s) Administered   DTaP 07/23/1993, 09/26/1993, 11/12/1993, 12/12/1994, 11/13/1998   HIB (PRP-OMP) 07/23/1993, 09/26/1993, 11/12/1993, 12/12/1994   HPV Quadrivalent 11/18/2012, 12/29/2012, 11/07/2013   Hepatitis A 01/28/2005, 09/15/2005   Hepatitis B 10-02-93, 07/23/1993, 11/12/1993   IPV 07/23/1993, 09/26/1993, 11/12/1993, 11/13/1998   Influenza,Quad,Nasal, Live 12/29/2012   Influenza-Unspecified 01/25/2004, 01/28/2005, 03/11/2006, 02/18/2007   MMR 05/21/1994, 11/13/1998   Meningococcal Conjugate 03/11/2006, 11/18/2012   PFIZER(Purple Top)SARS-COV-2 Vaccination 01/31/2020   Td 01/28/2005   Tdap 01/28/2005   Varicella 11/18/2012, 12/29/2012    Objective:  Physical Exam  BP (!) 122/90 (BP Location: Left Arm, Patient Position: Sitting)   Pulse 62   Temp 97.6 F (36.4 C) (Temporal)   Ht 6' (1.829 m)   Wt 243 lb 9.6 oz (110.5 kg)   SpO2 97%   BMI 33.04 kg/m  Wt Readings from Last 10 Encounters:  07/15/22 243 lb 9.6 oz (110.5 kg)  03/21/22 237 lb (107.5 kg)  03/11/22 237 lb 9.6 oz (107.8 kg)  12/24/21 242 lb 8 oz (110 kg)  10/22/21 232 lb 12.8 oz (105.6 kg)  06/04/21 232 lb 3.2 oz (105.3 kg)  02/27/21 240 lb (108.9 kg)  07/21/16 195 lb (88.5 kg)  11/07/13 195 lb 8.8 oz (88.7 kg)  12/29/12  199 lb 11.8 oz (90.6 kg) (92 %, Z= 1.42)*   * Growth percentiles are based on CDC (Boys, 2-20 Years) data.   Vital signs reviewed.  Nursing notes reviewed. Weight trend reviewed. Abnormalities and problem-specific physical exam findings:  pictures on his phone of his swollen pinky toe with a punctate area of malodorous drainage and erythema.  Tenderness all around the metatarsophalangeal joint 5th on left. 10 year(s) weight gain noted. General Appearance:  Well developed,  well nourished, well-groomed, healthy-appearing male with Body mass index is 33.04 kg/m. No acute distress appreciable.   Skin: Clear and well-hydrated. Pulmonary:  Normal work of breathing at rest, no respiratory distress apparent. SpO2: 97 %  Musculoskeletal: He demonstrates smooth and coordinated movements throughout all major joints. All extremities are intact.  Neurological:  Awake, alert, oriented, and engaged.  No obvious focal neurological deficits or cognitive impairments.  Sensorium seems unclouded. Gait is smooth and coordinated Psychiatric:  Appropriate mood, pleasant and cooperative demeanor, cheerful and engaged during the exam  Results Reviewed (reviewed labs/imaging may be also be found in the assessment / plan section):    Results for orders placed or performed in visit on 07/15/22  CBC  Result Value Ref Range   WBC 3.7 (L) 4.0 - 10.5 K/uL   RBC 4.42 4.22 - 5.81 Mil/uL   Platelets 183.0 150.0 - 400.0 K/uL   Hemoglobin 15.5 13.0 - 17.0 g/dL   HCT 21.3 08.6 - 57.8 %   MCV 100.7 (H) 78.0 - 100.0 fl   MCHC 34.8 30.0 - 36.0 g/dL   RDW 46.9 62.9 - 52.8 %  Comp Met (CMET)  Result Value Ref Range   Sodium 139 135 - 145 mEq/L   Potassium 4.2 3.5 - 5.1 mEq/L   Chloride 104 96 - 112 mEq/L   CO2 29 19 - 32 mEq/L   Glucose, Bld 87 70 - 99 mg/dL   BUN 11 6 - 23 mg/dL   Creatinine, Ser 4.13 0.40 - 1.50 mg/dL   Total Bilirubin 0.4 0.2 - 1.2 mg/dL   Alkaline Phosphatase 70 39 - 117 U/L   AST 51 (H) 0 - 37 U/L    ALT 139 (H) 0 - 53 U/L   Total Protein 7.0 6.0 - 8.3 g/dL   Albumin 4.4 3.5 - 5.2 g/dL   GFR 244.01 >02.72 mL/min   Calcium 9.2 8.4 - 10.5 mg/dL  Lipid panel  Result Value Ref Range   Cholesterol 176 0 - 200 mg/dL   Triglycerides 53.6 0.0 - 149.0 mg/dL   HDL 64.40 >34.74 mg/dL   VLDL 25.9 0.0 - 56.3 mg/dL   LDL Cholesterol 875 (H) 0 - 99 mg/dL   Total CHOL/HDL Ratio 4    NonHDL 134.98   Sedimentation rate  Result Value Ref Range   Sed Rate 11 0 - 15 mm/hr  C-reactive protein  Result Value Ref Range   CRP <1.0 0.5 - 20.0 mg/dL

## 2022-07-15 NOTE — Assessment & Plan Note (Addendum)
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  He reports he took the doxycycline consistently so I suspect it is a resistant bacteria in the region.  He is only prediabetic so I strongly doubt he has developed osteomyelitis however with the duration of this infection it needs to be ruled out.  Therefore we are going to hold off on taking an extra round of antibiotics and were going to repeat the x-ray to look for infection of the bone first along with esr/crp. I am going to send to a foot specialist if the x-ray of the the bone or today's blood work suggest osteomyelitis prior to antibiotics and I explained that the reason for this is that giving additional antibiotics at this time will not necessarily work but it will make it much harder for [place] to get a tissue sample that allows [place] to confirm the infection is in the bone so that we can choose an antibiotic that completely eradicate.  Another way of saying this is that antibiotics to try to eradicate the infection now actually increased the risk of ultimately losing the toe.   After labwork and XR returned same day today I reviewed the XR myself and it seemed negative for periosteal reaction. Esr and crp negative.  I ran the case past artificial intelligence to decide about empiric antibiotic(s) after this data and agree with its recommendations as follows:  Given the chronic nature of the toe pain, the presence of malodorous drainage, and the lack of improvement with topical antibiotics, a bacterial infection such as cellulitis is a consideration, especially in the context of skin splitting and tenderness  . However, the chronicity and specific symptoms also raise the possibility of underlying conditions such as a fungal infection or dermatological conditions like psoriasis or eczema, which can  predispose to secondary bacterial infection. The negative X-ray and laboratory inflammatory markers (CRP, ESR) suggest that there is no acute bone involvement or systemic inflammatory response, but do not rule out a localized infection or chronic condition  . The systemic symptoms mentioned, including frequent loose stools associated with eating greasy foods, may indicate a separate systemic issue such as a gastrointestinal disorder, which may not be directly related but could impact overall health and recovery. Given these considerations, the approach should include: Further testing to identify the causative agent, if bacterial, fungal, or another pathogen is involved. This could include a wound culture or skin scraping for fungal culture. A podiatry evaluation to assess for structural issues, chronic wounds, and to provide specialized care for the toe. Consideration of a dermatology consultation for evaluation of potential underlying skin conditions that could predispose to chronic infections. A review of the patient's gastrointestinal symptoms, potentially involving a gastroenterology consultation, to address the chronic loose stools and assess for any related systemic conditions.

## 2022-07-15 NOTE — Assessment & Plan Note (Signed)
I advised patient that Wellbutrin might be a good solution for a low cost approach that I could write for a whole year at a time and will still treat ADHD and weight loss a little bit

## 2022-07-16 LAB — HEPATITIS C ANTIBODY: Hepatitis C Ab: NONREACTIVE

## 2022-07-16 LAB — HIV ANTIBODY (ROUTINE TESTING W REFLEX): HIV 1&2 Ab, 4th Generation: NONREACTIVE

## 2022-07-17 NOTE — Addendum Note (Signed)
Addended by: Lula Olszewski on: 07/17/2022 08:58 PM   Modules accepted: Orders

## 2022-07-20 ENCOUNTER — Encounter: Payer: Self-pay | Admitting: Internal Medicine

## 2022-07-21 ENCOUNTER — Encounter: Payer: Self-pay | Admitting: Internal Medicine

## 2022-07-21 ENCOUNTER — Other Ambulatory Visit (HOSPITAL_COMMUNITY): Payer: Self-pay

## 2022-07-21 ENCOUNTER — Ambulatory Visit (INDEPENDENT_AMBULATORY_CARE_PROVIDER_SITE_OTHER): Payer: BC Managed Care – PPO | Admitting: Internal Medicine

## 2022-07-21 VITALS — BP 134/88 | HR 64 | Temp 98.4°F | Ht 72.0 in | Wt 245.0 lb

## 2022-07-21 DIAGNOSIS — L089 Local infection of the skin and subcutaneous tissue, unspecified: Secondary | ICD-10-CM | POA: Diagnosis not present

## 2022-07-21 HISTORY — DX: Local infection of the skin and subcutaneous tissue, unspecified: L08.9

## 2022-07-21 MED ORDER — DICLOFENAC SODIUM 75 MG PO TBEC
75.0000 mg | DELAYED_RELEASE_TABLET | Freq: Two times a day (BID) | ORAL | 0 refills | Status: AC
  Filled 2022-07-21: qty 60, 30d supply, fill #0

## 2022-07-21 MED ORDER — DICLOFENAC SODIUM 1 % EX GEL
4.0000 g | Freq: Four times a day (QID) | CUTANEOUS | 3 refills | Status: AC | PRN
  Filled 2022-07-21: qty 100, 7d supply, fill #0

## 2022-07-21 MED ORDER — TRAMADOL HCL 50 MG PO TABS
50.0000 mg | ORAL_TABLET | Freq: Three times a day (TID) | ORAL | 0 refills | Status: AC | PRN
  Filled 2022-07-21: qty 15, 5d supply, fill #0

## 2022-07-21 NOTE — Progress Notes (Signed)
[facility]: [phone]   Routine Medical Office Visit  Patient:  Juan Perkins      Age: 27 y.o.       Sex:  male  Date:   07/21/2022 PCP:    Lula Olszewski, MD   Today's Healthcare Provider: Lula Olszewski, MD   Assessment and Plan:   Foot infection Assessment & Plan: Explained my theory he has osteomyelitis  and why I believe he has it despite his negative x-ray and sed rate and CRP and why I ordered the MRI and I also try to get the MRI pushed up so that it would be before his foot doctor in 4 days.  No guarantees I reminded him it is very important NOT take any antibiotics even though they would help temporarily that would cause problems in the long run in terms of eradicating this infection because we need a good tissue culture to direct long term antibiotic(s) therapy.  Today the exam shows similar levels of toe pain and actually there is no discharge and is kind of formed a leathery reaction to the skin over lying where the infection probably is located mainly.  I do think he also has some cellulitis on the top of his foot as well but we can hold off on treating it until we get the the cultures.  I explained though that if he starts getting high fevers or getting spreading up the leg that he needs to go to the hospital for more emergent evaluation but that is unnecessary at this time requested something to help with the severe pain in the interim  Gave tramadol and diclofenac and Voltaren for pain management for now.   Orders: -     traMADol HCl; Take 1 tablet (50 mg) by mouth every 8 hours as needed  Dispense: 15 tablet; Refill: 0 -     Diclofenac Sodium; Apply 4 grams topically 4 times daily as needed.  Dispense: 100 g; Refill: 3 -     Diclofenac Sodium; Take 1 tablet (75 mg) by mouth 2 (two) times daily.  Dispense: 60 tablet; Refill: 0       Clinical Presentation:   27 y.o. male here today for Swollen/painful left pinky toe (Started Friday  with no)  HPI   Updated chart data:  Problem  Foot Infection   Photographs Taken Today:   27 year old male first noticed a little dot sometime last week on the bottom of his left pinky toe and then over the last week or so he has noticed worsening pain on the lateral aspect of his proximal pinky toe but no pain anywhere else.  However he does have colored skin changes over his top of his forefoot on the left but there is no pain or tenderness with the spots is only on the lateral foot and its only when you push up.  He actually has a picture of the dot on his phone which I am good x-ray take a picture of here from last week Photographs Taken Today:          Reviewed chart data: Active Ambulatory Problems    Diagnosis Date Noted   Knee pain, chronic 11/18/2012   Loss of weight 12/29/2012   Other malaise and fatigue 12/29/2012   Nausea alone 12/29/2012   Groin discomfort, right 03/06/2021   Loose bowel movement 07/15/2022   Chronic pain of toe 07/15/2022   GERD (gastroesophageal reflux disease) 07/15/2022   Attention deficit hyperactivity disorder (  ADHD) 07/15/2022   Obesity due to energy imbalance 07/15/2022   Leucopenia 07/15/2022   Venous reflux 07/15/2022   Elevated LFTs 07/15/2022   Macrocytosis without anemia 07/15/2022   Foot infection 07/21/2022   Resolved Ambulatory Problems    Diagnosis Date Noted   Overweight 11/18/2012   Past Medical History:  Diagnosis Date   ADHD (attention deficit hyperactivity disorder) 07/14/2001   Anxiety    Asthma 02/13/2009    Outpatient Medications Prior to Visit  Medication Sig   famotidine-calcium carbonate-magnesium hydroxide (PEPCID COMPLETE) 10-800-165 MG chewable tablet Chew 1 tablet by mouth daily as needed.   lisdexamfetamine (VYVANSE) 50 MG capsule Take 1 capsule (50 mg total) by mouth daily.   methocarbamol (ROBAXIN) 500 MG tablet Take 1 tablet (500 mg total) by mouth every 8 (eight) hours as needed for muscle spasms.    valACYclovir (VALTREX) 500 MG tablet Take 1 tablet (500 mg total) by mouth daily.   No facility-administered medications prior to visit.             Clinical Data Analysis:   Physical Exam  BP 134/88 (BP Location: Left Arm, Patient Position: Sitting)   Pulse 64   Temp 98.4 F (36.9 C) (Temporal)   Ht 6' (1.829 m)   Wt 245 lb (111.1 kg)   SpO2 99%   BMI 33.23 kg/m  Wt Readings from Last 10 Encounters:  07/21/22 245 lb (111.1 kg)  07/15/22 243 lb 9.6 oz (110.5 kg)  03/21/22 237 lb (107.5 kg)  03/11/22 237 lb 9.6 oz (107.8 kg)  12/24/21 242 lb 8 oz (110 kg)  10/22/21 232 lb 12.8 oz (105.6 kg)  06/04/21 232 lb 3.2 oz (105.3 kg)  02/27/21 240 lb (108.9 kg)  07/21/16 195 lb (88.5 kg)  11/07/13 195 lb 8.8 oz (88.7 kg)   Vital signs reviewed.  Nursing notes reviewed. Weight trend reviewed. Abnormalities and Problem-Specific physical exam findings:  see images of affected toe and suspected source of infection(s) toe/foot. General Appearance:  No acute distress appreciable.   Well-groomed, healthy-appearing male.  Well proportioned with no abnormal fat distribution.  Good muscle tone. Skin: Clear and well-hydrated. Pulmonary:  Normal work of breathing at rest, no respiratory distress apparent. SpO2: 99 %  Musculoskeletal: All extremities are intact.  Neurological:  Awake, alert, oriented, and engaged.  No obvious focal neurological deficits or cognitive impairments.  Sensorium seems unclouded. Gait is smooth and coordinated.  Speech is clear and coherent with logical content. Psychiatric:  Appropriate mood, pleasant and cooperative demeanor, cheerful and engaged during the exam   Additional Results Reviewed:     No results found for any visits on 07/21/22.  Recent Results (from the past 2160 hour(s))  CBC     Status: Abnormal   Collection Time: 07/15/22 10:24 AM  Result Value Ref Range   WBC 3.7 (L) 4.0 - 10.5 K/uL   RBC 4.42 4.22 - 5.81 Mil/uL   Platelets 183.0 150.0 -  400.0 K/uL   Hemoglobin 15.5 13.0 - 17.0 g/dL   HCT 16.1 09.6 - 04.5 %   MCV 100.7 (H) 78.0 - 100.0 fl   MCHC 34.8 30.0 - 36.0 g/dL   RDW 40.9 81.1 - 91.4 %  Comp Met (CMET)     Status: Abnormal   Collection Time: 07/15/22 10:24 AM  Result Value Ref Range   Sodium 139 135 - 145 mEq/L   Potassium 4.2 3.5 - 5.1 mEq/L   Chloride 104 96 - 112 mEq/L   CO2  29 19 - 32 mEq/L   Glucose, Bld 87 70 - 99 mg/dL   BUN 11 6 - 23 mg/dL   Creatinine, Ser 0.98 0.40 - 1.50 mg/dL   Total Bilirubin 0.4 0.2 - 1.2 mg/dL   Alkaline Phosphatase 70 39 - 117 U/L   AST 51 (H) 0 - 37 U/L   ALT 139 (H) 0 - 53 U/L   Total Protein 7.0 6.0 - 8.3 g/dL   Albumin 4.4 3.5 - 5.2 g/dL   GFR 119.14 >78.29 mL/min    Comment: Calculated using the CKD-EPI Creatinine Equation (2021)   Calcium 9.2 8.4 - 10.5 mg/dL  Lipid panel     Status: Abnormal   Collection Time: 07/15/22 10:24 AM  Result Value Ref Range   Cholesterol 176 0 - 200 mg/dL    Comment: ATP III Classification       Desirable:  < 200 mg/dL               Borderline High:  200 - 239 mg/dL          High:  > = 562 mg/dL   Triglycerides 13.0 0.0 - 149.0 mg/dL    Comment: Normal:  <865 mg/dLBorderline High:  150 - 199 mg/dL   HDL 78.46 >96.29 mg/dL   VLDL 52.8 0.0 - 41.3 mg/dL   LDL Cholesterol 244 (H) 0 - 99 mg/dL   Total CHOL/HDL Ratio 4     Comment:                Men          Women1/2 Average Risk     3.4          3.3Average Risk          5.0          4.42X Average Risk          9.6          7.13X Average Risk          15.0          11.0                       NonHDL 134.98     Comment: NOTE:  Non-HDL goal should be 30 mg/dL higher than patient's LDL goal (i.e. LDL goal of < 70 mg/dL, would have non-HDL goal of < 100 mg/dL)  Hepatitis C Antibody     Status: None   Collection Time: 07/15/22 10:24 AM  Result Value Ref Range   Hepatitis C Ab NON-REACTIVE NON-REACTIVE    Comment: . HCV antibody was non-reactive. There is no laboratory  evidence of HCV  infection. . In most cases, no further action is required. However, if recent HCV exposure is suspected, a test for HCV RNA (test code 01027) is suggested. . For additional information please refer to http://education.[facility].com/faq/FAQ22v1 (This link is being provided for informational/ educational purposes only.) .   HIV antibody (with reflex)     Status: None   Collection Time: 07/15/22 10:24 AM  Result Value Ref Range   HIV 1&2 Ab, 4th Generation NON-REACTIVE NON-REACTIVE    Comment: HIV-1 antigen and HIV-1/HIV-2 antibodies were not detected. There is no laboratory evidence of HIV infection. Marland Kitchen PLEASE NOTE: This information has been disclosed to you from records whose confidentiality may be protected by state law.  If your state requires such protection, then the state law prohibits you from making any further disclosure of the  information without the specific written consent of the person to whom it pertains, or as otherwise permitted by law. A general authorization for the release of medical or other information is NOT sufficient for this purpose. . For additional information please refer to http://education.[facility].com/faq/FAQ106 (This link is being provided for informational/ educational purposes only.) . Marland Kitchen The performance of this assay has not been clinically validated in patients less than 71 years old. .   Sedimentation rate     Status: None   Collection Time: 07/15/22 10:24 AM  Result Value Ref Range   Sed Rate 11 0 - 15 mm/hr  C-reactive protein     Status: None   Collection Time: 07/15/22 10:24 AM  Result Value Ref Range   CRP <1.0 0.5 - 20.0 mg/dL    No image results found.   DG Foot 2 Views Left  Result Date: 07/16/2022 CLINICAL DATA:  Chronic foot pain after infection. Still draining motors. Strongly suspicious for osteomyelitis. Had a cut on bottom of fifth digit. Swelling and pain since then. EXAM: LEFT FOOT - 2 VIEW COMPARISON:   Left foot radiographs 03/21/2022 FINDINGS: No definite soft tissue ulcer is identified, with attention to the fifth toe. No cortical erosion is seen. No subcutaneous air. Mild dorsal and minimal lateral great toe metatarsophalangeal degenerative spurring without joint space narrowing. Mild dorsal talonavicular degenerative osteophytosis. There is a 5 mm chronic well corticated ossicle dorsal to the talonavicular joint, unchanged. No acute fracture or dislocation. IMPRESSION: No definite soft tissue ulcer is identified, with attention to the fifth toe. No radiographic evidence of osteomyelitis. Electronically Signed   By: Neita Garnet M.D.   On: 07/16/2022 15:12     --------------------------------    Signed: Lula Olszewski, MD 07/21/2022 8:51 PM

## 2022-07-21 NOTE — Assessment & Plan Note (Addendum)
Explained my theory he has osteomyelitis  and why I believe he has it despite his negative x-ray and sed rate and CRP and why I ordered the MRI and I also try to get the MRI pushed up so that it would be before his foot doctor in 4 days.  No guarantees I reminded him it is very important NOT take any antibiotics even though they would help temporarily that would cause problems in the long run in terms of eradicating this infection because we need a good tissue culture to direct long term antibiotic(s) therapy.  Today the exam shows similar levels of toe pain and actually there is no discharge and is kind of formed a leathery reaction to the skin over lying where the infection probably is located mainly.  I do think he also has some cellulitis on the top of his foot as well but we can hold off on treating it until we get the the cultures.  I explained though that if he starts getting high fevers or getting spreading up the leg that he needs to go to the hospital for more emergent evaluation but that is unnecessary at this time requested something to help with the severe pain in the interim  Gave tramadol and diclofenac and Voltaren for pain management for now.

## 2022-07-24 ENCOUNTER — Other Ambulatory Visit (HOSPITAL_COMMUNITY): Payer: Self-pay

## 2022-07-24 ENCOUNTER — Other Ambulatory Visit: Payer: Self-pay | Admitting: Internal Medicine

## 2022-07-24 DIAGNOSIS — R52 Pain, unspecified: Secondary | ICD-10-CM

## 2022-07-24 MED ORDER — KETOROLAC TROMETHAMINE 10 MG PO TABS
10.0000 mg | ORAL_TABLET | Freq: Four times a day (QID) | ORAL | 0 refills | Status: AC | PRN
  Filled 2022-07-24: qty 2, 1d supply, fill #0
  Filled 2022-07-24: qty 18, 4d supply, fill #0

## 2022-07-25 ENCOUNTER — Ambulatory Visit (INDEPENDENT_AMBULATORY_CARE_PROVIDER_SITE_OTHER): Payer: BC Managed Care – PPO

## 2022-07-25 ENCOUNTER — Encounter: Payer: Self-pay | Admitting: Podiatry

## 2022-07-25 ENCOUNTER — Ambulatory Visit: Payer: BC Managed Care – PPO | Admitting: Podiatry

## 2022-07-25 ENCOUNTER — Other Ambulatory Visit (HOSPITAL_COMMUNITY): Payer: Self-pay

## 2022-07-25 DIAGNOSIS — L02612 Cutaneous abscess of left foot: Secondary | ICD-10-CM | POA: Diagnosis not present

## 2022-07-25 DIAGNOSIS — M79672 Pain in left foot: Secondary | ICD-10-CM | POA: Diagnosis not present

## 2022-07-25 DIAGNOSIS — L03116 Cellulitis of left lower limb: Secondary | ICD-10-CM

## 2022-07-25 DIAGNOSIS — L03119 Cellulitis of unspecified part of limb: Secondary | ICD-10-CM

## 2022-07-25 DIAGNOSIS — L02619 Cutaneous abscess of unspecified foot: Secondary | ICD-10-CM

## 2022-07-25 MED ORDER — AMOXICILLIN-POT CLAVULANATE 875-125 MG PO TABS
1.0000 | ORAL_TABLET | Freq: Two times a day (BID) | ORAL | 0 refills | Status: AC
  Filled 2022-07-25: qty 20, 10d supply, fill #0

## 2022-07-28 LAB — WOUND CULTURE
MICRO NUMBER:: 14879816
SPECIMEN QUALITY:: ADEQUATE

## 2022-07-28 NOTE — Progress Notes (Signed)
Subjective:   Patient ID: Juan Perkins, male   DOB: 27 y.o.   MRN: [id number]   HPI Patient presents with significant discomfort in the left fifth digit and between the left fourth and fifth toes.  Patient stated this happened in December also and he was on 1 round of antibiotics that only helped temporarily and then this reoccurred again.  States it is very tender patient's not had any systemic signs of infection currently patient smokes half a pack per day and works full-time   Review of Systems  All other systems reviewed and are negative.       Objective:  Physical Exam Vitals and nursing note reviewed.  Constitutional:      Appearance: He is well-developed.  Pulmonary:     Effort: Pulmonary effort is normal.  Musculoskeletal:        General: Normal range of motion.  Skin:    General: Skin is warm.  Neurological:     Mental Status: He is alert.     Neurovascular status was found to be intact with muscle strength adequate range of motion adequate.  Patient is fourth fingers visual left is quite red and it is sore and there is on the plantar portion some indications of abscess.  It is localized but he does have some discomfort into the forefoot left with no streaking and no indications of pathology past that point.  Patient has no systemic signs of infection normal temperature     Assessment:  Abscess of the fourth interspace left causing infection localized with the patient who is at risk of this does not have systemic condition contributing     Plan:  H&P x-rays taken discussed condition and did proximal block of the left forefoot and then went ahead open the area up I saw some white drainage in the area which I cultured and I flushed out the area I applied sterile dressing I gave instructions on soaks dispensed surgical shoe and started on Augmentin 875 twice daily.  We will get the results of culture I gave him strict instructions of any systemic signs of infection  were to occur he is to go straight to the emergency room  X-rays indicate no signs of osteolysis or bony pathology associated with condition

## 2022-07-29 ENCOUNTER — Other Ambulatory Visit: Payer: Self-pay | Admitting: Podiatry

## 2022-07-29 DIAGNOSIS — M79672 Pain in left foot: Secondary | ICD-10-CM

## 2022-07-29 DIAGNOSIS — L02619 Cutaneous abscess of unspecified foot: Secondary | ICD-10-CM

## 2022-08-01 ENCOUNTER — Other Ambulatory Visit (HOSPITAL_COMMUNITY): Payer: Self-pay

## 2022-08-01 ENCOUNTER — Ambulatory Visit (INDEPENDENT_AMBULATORY_CARE_PROVIDER_SITE_OTHER): Payer: BC Managed Care – PPO | Admitting: Podiatry

## 2022-08-01 DIAGNOSIS — L03119 Cellulitis of unspecified part of limb: Secondary | ICD-10-CM

## 2022-08-01 DIAGNOSIS — L02619 Cutaneous abscess of unspecified foot: Secondary | ICD-10-CM

## 2022-08-01 MED ORDER — DOXYCYCLINE HYCLATE 100 MG PO TABS
100.0000 mg | ORAL_TABLET | Freq: Two times a day (BID) | ORAL | 0 refills | Status: AC
  Filled 2022-08-01: qty 20, 10d supply, fill #0

## 2022-08-03 NOTE — Progress Notes (Signed)
Subjective:   Patient ID: Juan Perkins, male   DOB: 27 y.o.   MRN: [id number]   HPI Patient states he is doing much better still feeling mild pain but improved   ROS      Objective:  Physical Exam  Neurovascular status intact with significant improvement fourth interspace left with diminished erythema with mild edema still in the forefoot but localized no proximal signs of infective process     Assessment:  Significant improvement with probability for abscess of the left fourth interspace with bacteria which appears to be staph but hard to identify     Plan:  H&P reviewed continue soaks and I discussed switching over to doxycycline for 10 more days and if any issues were to occur he is to reappoint but I am hoping this will be the end of this issue and if it does recur we will have to consider syndactylization or other procedure.  Reappoint as symptoms indicate

## 2022-08-08 ENCOUNTER — Other Ambulatory Visit (HOSPITAL_COMMUNITY): Payer: Self-pay

## 2022-08-14 ENCOUNTER — Ambulatory Visit: Payer: BC Managed Care – PPO | Admitting: Internal Medicine

## 2022-08-15 ENCOUNTER — Other Ambulatory Visit (HOSPITAL_COMMUNITY): Payer: Self-pay

## 2022-08-15 ENCOUNTER — Encounter: Payer: Self-pay | Admitting: Internal Medicine

## 2022-08-15 ENCOUNTER — Ambulatory Visit: Payer: BC Managed Care – PPO | Admitting: Internal Medicine

## 2022-08-15 VITALS — BP 124/76 | HR 57 | Temp 96.7°F | Ht 72.0 in | Wt 243.4 lb

## 2022-08-15 DIAGNOSIS — E669 Obesity, unspecified: Secondary | ICD-10-CM

## 2022-08-15 DIAGNOSIS — G8929 Other chronic pain: Secondary | ICD-10-CM

## 2022-08-15 DIAGNOSIS — M25562 Pain in left knee: Secondary | ICD-10-CM

## 2022-08-15 DIAGNOSIS — D72819 Decreased white blood cell count, unspecified: Secondary | ICD-10-CM

## 2022-08-15 DIAGNOSIS — D7589 Other specified diseases of blood and blood-forming organs: Secondary | ICD-10-CM | POA: Diagnosis not present

## 2022-08-15 DIAGNOSIS — F17218 Nicotine dependence, cigarettes, with other nicotine-induced disorders: Secondary | ICD-10-CM

## 2022-08-15 DIAGNOSIS — R110 Nausea: Secondary | ICD-10-CM

## 2022-08-15 DIAGNOSIS — R7303 Prediabetes: Secondary | ICD-10-CM | POA: Insufficient documentation

## 2022-08-15 DIAGNOSIS — R634 Abnormal weight loss: Secondary | ICD-10-CM

## 2022-08-15 DIAGNOSIS — R195 Other fecal abnormalities: Secondary | ICD-10-CM

## 2022-08-15 DIAGNOSIS — K219 Gastro-esophageal reflux disease without esophagitis: Secondary | ICD-10-CM

## 2022-08-15 DIAGNOSIS — R7989 Other specified abnormal findings of blood chemistry: Secondary | ICD-10-CM | POA: Diagnosis not present

## 2022-08-15 LAB — CBC WITH DIFFERENTIAL/PLATELET
Basophils Absolute: 0 10*3/uL (ref 0.0–0.1)
Basophils Relative: 0.2 % (ref 0.0–3.0)
Eosinophils Absolute: 0.2 10*3/uL (ref 0.0–0.7)
Eosinophils Relative: 5.3 % — ABNORMAL HIGH (ref 0.0–5.0)
HCT: 44.6 % (ref 39.0–52.0)
Hemoglobin: 15.5 g/dL (ref 13.0–17.0)
Lymphocytes Relative: 55.8 % — ABNORMAL HIGH (ref 12.0–46.0)
Lymphs Abs: 1.9 10*3/uL (ref 0.7–4.0)
MCHC: 34.7 g/dL (ref 30.0–36.0)
MCV: 100.3 fl — ABNORMAL HIGH (ref 78.0–100.0)
Monocytes Absolute: 0.3 10*3/uL (ref 0.1–1.0)
Monocytes Relative: 7.5 % (ref 3.0–12.0)
Neutro Abs: 1.1 10*3/uL — ABNORMAL LOW (ref 1.4–7.7)
Neutrophils Relative %: 31.2 % — ABNORMAL LOW (ref 43.0–77.0)
Platelets: 187 10*3/uL (ref 150.0–400.0)
RBC: 4.44 Mil/uL (ref 4.22–5.81)
RDW: 12.6 % (ref 11.5–15.5)
WBC: 3.4 10*3/uL — ABNORMAL LOW (ref 4.0–10.5)

## 2022-08-15 LAB — COMPREHENSIVE METABOLIC PANEL
ALT: 65 U/L — ABNORMAL HIGH (ref 0–53)
AST: 29 U/L (ref 0–37)
Albumin: 4.2 g/dL (ref 3.5–5.2)
Alkaline Phosphatase: 73 U/L (ref 39–117)
BUN: 13 mg/dL (ref 6–23)
CO2: 26 mEq/L (ref 19–32)
Calcium: 9.2 mg/dL (ref 8.4–10.5)
Chloride: 107 mEq/L (ref 96–112)
Creatinine, Ser: 1.07 mg/dL (ref 0.40–1.50)
GFR: 93.96 mL/min (ref >60.00)
Glucose, Bld: 95 mg/dL (ref 70–99)
Potassium: 4.2 mEq/L (ref 3.5–5.1)
Sodium: 141 mEq/L (ref 135–145)
Total Bilirubin: 0.4 mg/dL (ref 0.2–1.2)
Total Protein: 6.9 g/dL (ref 6.0–8.3)

## 2022-08-15 LAB — B12 AND FOLATE PANEL
Folate: 8.9 ng/mL (ref >5.9)
Vitamin B-12: 320 pg/mL (ref 211–911)

## 2022-08-15 MED ORDER — ONDANSETRON HCL 4 MG PO TABS
4.0000 mg | ORAL_TABLET | Freq: Three times a day (TID) | ORAL | 0 refills | Status: AC | PRN
  Filled 2022-08-15: qty 20, 7d supply, fill #0

## 2022-08-15 NOTE — Assessment & Plan Note (Signed)
Resolving/not losing weight anymore Previous weight loss reviewed- will resolve this as a problem but leave on pmh Encouraged patient to  to actually try to lose weight. Probably was due to whatever going on with liver and bowel which still needs further investigation

## 2022-08-15 NOTE — Assessment & Plan Note (Addendum)
Thoroughly reviewed with patient this issue which seems to be worsening with no apparent cause Gastrointestinal referral placed 06/2022, advised patient to call [facility] to make it happen Cause of worsening LFT not obvious  Will get more labs to investigate cause Will get ultrasound

## 2022-08-15 NOTE — Progress Notes (Signed)
[facility]: [phone]   Routine Medical Office Visit  Patient:  Juan Perkins      Age: 28 y.o.       Sex:  male  Date:   08/15/2022 PCP:    Lula Olszewski, MD   Today's Healthcare Provider: Lula Olszewski, MD   Assessment and Plan:   Patient feeling much better, considered canceling appointment - but glad he came due to we needed to follow up on worsening LFT, chronic macrocytosis, and leucopenia. He had chronic foot infection that has resolved ... And I don't think is related to the hematological and liver issues.   Elevated LFTs Assessment & Plan: Thoroughly reviewed with patient this issue which seems to be worsening with no apparent cause Gastrointestinal referral placed 06/2022, advised patient to call [facility] to make it happen Cause of worsening LFT not obvious  Will get more labs to investigate cause Will get ultrasound   Orders: -     Comprehensive metabolic panel -     Hepatitis panel, acute -     Mitochondrial antibodies -     Anti-smooth muscle antibody, IgG -     AntiMicrosomal Ab-Liver / Kidney -     US Abdomen Complete; Future -     ANA -     Celiac Disease Comprehensive Panel with Reflexes  Leukopenia, unspecified 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 Identified as high-priority issue Collaboratively explored benefits/risks/costs of workup/treatment options: diagnostic labwork  Patient understood risks of delaying proposed options and agreed to plan aligned with their goals/preferences.       Orders: -     Methylmalonic acid, serum -     Protein electrophoresis, serum -     Pathologist smear review -     HIV Antibody (routine testing w rflx) -     CBC with Differential/Platelet -     B12 and Folate Panel -     Celiac  Disease Comprehensive Panel with Reflexes  Macrocytosis without anemia Assessment & Plan: Identified as high-priority issue but seems chronic and less worrisome than LFT. Alcohol doesn't seem responsible. Collaboratively explored benefits/risks/costs of workup/treatment options: additional labwork recommended, and exploration of underlying cause, consider hematology pending diagnostic labwork  Patient understood risks of delaying proposed options and agreed to plan aligned with their goals/preferences.    Orders: -     Protein electrophoresis, serum -     Pathologist smear review -     HIV Antibody (routine testing w rflx) -     CBC with Differential/Platelet -     B12 and Folate Panel  Loss of weight Assessment & Plan: Resolving/not losing weight anymore Previous weight loss reviewed- will resolve this as a problem but leave on pmh Encouraged patient to  to actually try to lose weight. Probably was due to whatever going on with liver and bowel which still needs further investigation  Orders: -     Celiac Disease Comprehensive Panel with Reflexes  Prediabetes  Chronic pain of left knee Assessment & Plan: This problem was evaluated and confirmed to be present, but stable. We deferred addressing it at this time due to higher priority concerns discussed elsewhere in this document. We will plan follow up appointments to address it with ongoing continuity of care visits.    Nausea Assessment & Plan: Seems to be  improving Has Zofran available.  Orders: -     Ondansetron HCl; Take 1 tablet (4 mg total) by mouth every 8 (eight) hours as needed for nausea or vomiting.  Dispense: 20 tablet; Refill: 0  Loose bowel movement -     Celiac Disease Comprehensive Panel with Reflexes  Gastroesophageal reflux disease without esophagitis Assessment & Plan: Have encouraged as needed Pepcid complete. This problem was evaluated and confirmed to be present, but stable. We deferred addressing  it at this time due to higher priority concerns discussed elsewhere in this document. We will plan follow up appointments to address it with ongoing continuity of care visits.    Obesity due to energy imbalance Assessment & Plan: Was losing weight with illness but now gaining again Encouraged weight loss      Treatment plan discussed and reviewed in detail.  Agreed on patient returning to office if symptoms worsen, persist, or new symptoms develop. Discussed precautions in case of needing to visit the Emergency Department. Answered all patient questions and confirmed understanding and comfort with the plan. Encouraged patient to contact our office if they have any questions or concerns. Patient agreed to follow up in 1 month        Clinical Presentation:    27 y.o. male here today for Medical Management of Chronic Issues  HPI   The following table summarizes the actions taken during the encounter to manage (by editing,updating, adding,and resolving)  Problem  Gerd (Gastroesophageal Reflux Disease)  Obesity Due to Energy Imbalance  Leucopenia   Lab Results  Component Value Date/Time   WBC 3.7 (L) 07/15/2022 10:24 AM   WBC 3.0 (L) 10/22/2021 11:01 AM   WBC 4.4 06/04/2021 04:01 PM   WBC 3.0 (L) 07/21/2016 12:33 PM   WBC 3.4 (L) 09/11/2015 10:17 AM   Lab Results  Component Value Date/Time   VITAMINB12 364 07/21/2016 12:33 PM   Lab Results  Component Value Date/Time   ALT 139 (H) 07/15/2022 10:24 AM   ALT 92 (H) 10/22/2021 11:01 AM   ALT 67 (H) 06/04/2021 04:01 PM   ALT 47 (H) 07/21/2016 12:33 PM   ALT 37 12/29/2012 01:35 PM   Lab Results  Component Value Date/Time   AST 51 (H) 07/15/2022 10:24 AM   AST 38 (H) 10/22/2021 11:01 AM   AST 27 06/04/2021 04:01 PM   AST 39 07/21/2016 12:33 PM   AST 22 12/29/2012 01:35 PM   Lab Results  Component Value Date/Time   ALKPHOS 70 07/15/2022 10:24 AM   ALKPHOS 67 10/22/2021 11:01 AM   ALKPHOS 68 06/04/2021 04:01 PM   ALKPHOS 60  07/21/2016 12:33 PM   ALKPHOS 80 12/29/2012 01:35 PM   HIV NON-REACTIVE (07/25 1101)      Elevated Lfts   09/2021 limited ultrasound abdomen No focal lesion identified. Within normal limits in parenchymal echogenicity. Portal vein is patent on color Doppler imaging with normal direction of blood flow towards the liver.  Lab Results  Component Value Date/Time   ALT 139 (H) 07/15/2022 10:24 AM   ALT 92 (H) 10/22/2021 11:01 AM   ALT 67 (H) 06/04/2021 04:01 PM   ALT 47 (H) 07/21/2016 12:33 PM   ALT 37 12/29/2012 01:35 PM   Lab Results  Component Value Date/Time   AST 51 (H) 07/15/2022 10:24 AM   AST 38 (H) 10/22/2021 11:01 AM   AST 27 06/04/2021 04:01 PM   AST 39 07/21/2016 12:33 PM   AST 22 12/29/2012 01:35 PM  Lab Results  Component Value Date/Time   ALKPHOS 70 07/15/2022 10:24 AM   ALKPHOS 67 10/22/2021 11:01 AM   ALKPHOS 68 06/04/2021 04:01 PM   ALKPHOS 60 07/21/2016 12:33 PM   ALKPHOS 80 12/29/2012 01:35 PM  No components found for: "BILIT" No results found for: "LABGGT"       Component Value Date/Time   BILITOT 0.4 07/15/2022 1024      Latest Ref Rng & Units 07/15/2022   10:24 AM 10/22/2021   11:01 AM 06/04/2021    4:01 PM  CBC  WBC 4.0 - 10.5 K/uL 3.7  3.0  4.4   Hemoglobin 13.0 - 17.0 g/dL 29.5  62.1  30.8   Hematocrit 39.0 - 52.0 % 44.6  45.1  43.4   Platelets 150.0 - 400.0 K/uL 183.0  160.0  163.0   Body mass index is 33.04 kg/m. Lab Results  Component Value Date   HGBA1C 5.8 10/22/2021   HGBA1C 5.7 06/04/2021   HGBA1C 4.9 07/21/2016       Macrocytosis Without Anemia   Does endorse only 1-2 alcohol a month   Lab Results  Component Value Date/Time   MCV 100.7 (H) 07/15/2022 10:24 AM   MCV 102.1 (H) 10/22/2021 11:01 AM   MCV 101.8 (H) 06/04/2021 04:01 PM   MCV 100.0 07/21/2016 12:33 PM   MCV 94.9 09/11/2015 10:17 AM   Lab Results  Component Value Date   MCV 100.7 (H) 07/15/2022   MCV 102.1 (H) 10/22/2021   MCV 101.8 (H) 06/04/2021    MCV 100.0 07/21/2016   MCV 94.9 09/11/2015  Per last social history update,  reports current alcohol use of about 14.0 standard drinks of alcohol per week. Lab Results  Component Value Date   HGB 15.5 07/15/2022   Lab Results  Component Value Date   VITAMINB12 364 07/21/2016  No components found for: "MMA" Lab Results  Component Value Date   FOLATE 10.4 07/21/2016   Lab Results  Component Value Date   TSH 1.38 10/22/2021   TSH 0.43 06/04/2021   TSH 0.45 07/21/2016  No results found for: "FREET4" No results found for: "SPEP" No results found for: "UPEP"    Latest Ref Rng & Units 07/15/2022   10:24 AM  Serum Protein Electrophoresis  Total Protein 6.0 - 8.3 g/dL 7.0       Nausea   Really only comes if hasn't eaten Hasn't used zofran   Knee Pain, Chronic   Comes and goes, due to old injury   Loss of Weight (Resolved)   Increased body mass noted.  Body mass index is 33.01 kg/m.  Well proportioned with no abnormal fat distribution.  Good muscle tone.  Lab Results  Component Value Date   TSH 1.38 10/22/2021   Lab Results  Component Value Date   HGBA1C 5.8 10/22/2021   Wt Readings from Last 10 Encounters:  08/15/22 243 lb 6.4 oz (110.4 kg)  07/21/22 245 lb (111.1 kg)  07/15/22 243 lb 9.6 oz (110.5 kg)  03/21/22 237 lb (107.5 kg)  03/11/22 237 lb 9.6 oz (107.8 kg)  12/24/21 242 lb 8 oz (110 kg)  10/22/21 232 lb 12.8 oz (105.6 kg)  06/04/21 232 lb 3.2 oz (105.3 kg)  02/27/21 240 lb (108.9 kg)  07/21/16 195 lb (88.5 kg)        Reviewed chart data: Active Ambulatory Problems    Diagnosis Date Noted   Knee pain, chronic 11/18/2012   Other malaise and fatigue 12/29/2012   Nausea  12/29/2012   Groin discomfort, right 03/06/2021   Loose bowel movement 07/15/2022   GERD (gastroesophageal reflux disease) 07/15/2022   Attention deficit hyperactivity disorder (ADHD) 07/15/2022   Obesity due to energy imbalance 07/15/2022   Leucopenia 07/15/2022   Venous reflux  07/15/2022   Elevated LFTs 07/15/2022   Macrocytosis without anemia 07/15/2022   Prediabetes 08/15/2022   Resolved Ambulatory Problems    Diagnosis Date Noted   Overweight 11/18/2012   Loss of weight 12/29/2012   Chronic pain of toe 07/15/2022   Foot infection 07/21/2022   Past Medical History:  Diagnosis Date   ADHD (attention deficit hyperactivity disorder) 07/14/2001   Anxiety    Asthma 02/13/2009    Outpatient Medications Prior to Visit  Medication Sig   famotidine-calcium carbonate-magnesium hydroxide (PEPCID COMPLETE) [phone] MG chewable tablet Chew 1 tablet by mouth daily as needed.   ketorolac (TORADOL) 10 MG tablet Take 1 tablet (10 mg total) by mouth every 6 (six) hours as needed for severe or moderate pain. Do not take with ibuprofen, naproxen, or diclofenac.  Take with food.  May need to take heartburn medications. For severe pain-5 day duration maximum.   methocarbamol (ROBAXIN) 500 MG tablet Take 1 tablet (500 mg total) by mouth every 8 (eight) hours as needed for muscle spasms.   valACYclovir (VALTREX) 500 MG tablet Take 1 tablet (500 mg total) by mouth daily.   amoxicillin-clavulanate (AUGMENTIN) 875-125 MG tablet Take 1 tablet by mouth 2 (two) times daily. (Patient not taking: Reported on 08/15/2022)   diclofenac (VOLTAREN) 75 MG EC tablet Take 1 tablet (75 mg) by mouth 2 (two) times daily. (Patient not taking: Reported on 08/15/2022)   diclofenac Sodium (VOLTAREN) 1 % GEL Apply 4 grams topically 4 times daily as needed.   doxycycline (VIBRA-TABS) 100 MG tablet Take 1 tablet (100 mg total) by mouth 2 (two) times daily.   No facility-administered medications prior to visit.         Clinical Data Analysis:   Physical Exam  BP 124/76 (BP Location: Left Arm, Patient Position: Sitting, Cuff Size: Large)   Pulse (!) 57   Temp (!) 96.7 F (35.9 C) (Temporal)   Ht 6' (1.829 m)   Wt 243 lb 6.4 oz (110.4 kg)   SpO2 99%   BMI 33.01 kg/m  Wt Readings from Last 10  Encounters:  08/15/22 243 lb 6.4 oz (110.4 kg)  07/21/22 245 lb (111.1 kg)  07/15/22 243 lb 9.6 oz (110.5 kg)  03/21/22 237 lb (107.5 kg)  03/11/22 237 lb 9.6 oz (107.8 kg)  12/24/21 242 lb 8 oz (110 kg)  10/22/21 232 lb 12.8 oz (105.6 kg)  06/04/21 232 lb 3.2 oz (105.3 kg)  02/27/21 240 lb (108.9 kg)  07/21/16 195 lb (88.5 kg)   Vital signs reviewed.  Nursing notes reviewed. Weight trend reviewed. Abnormalities and Problem-Specific physical exam findings:  mild truncal adiposity  General Appearance:  No acute distress appreciable.   Well-groomed, healthy-appearing male.  Well proportioned with no abnormal fat distribution.  Good muscle tone. Skin: Clear and well-hydrated. Pulmonary:  Normal work of breathing at rest, no respiratory distress apparent. SpO2: 99 %  Musculoskeletal: All extremities are intact.  Neurological:  Awake, alert, oriented, and engaged.  No obvious focal neurological deficits or cognitive impairments.  Sensorium seems unclouded.   Speech is clear and coherent with logical content. Psychiatric:  Appropriate mood, pleasant and cooperative demeanor, thoughtful and engaged during the exam  Results Reviewed:    Results  for orders placed or performed in visit on 08/15/22  Comp Met (CMET)  Result Value Ref Range   Sodium 141 135 - 145 mEq/L   Potassium 4.2 3.5 - 5.1 mEq/L   Chloride 107 96 - 112 mEq/L   CO2 26 19 - 32 mEq/L   Glucose, Bld 95 70 - 99 mg/dL   BUN 13 6 - 23 mg/dL   Creatinine, Ser 6.57 0.40 - 1.50 mg/dL   Total Bilirubin 0.4 0.2 - 1.2 mg/dL   Alkaline Phosphatase 73 39 - 117 U/L   AST 29 0 - 37 U/L   ALT 65 (H) 0 - 53 U/L   Total Protein 6.9 6.0 - 8.3 g/dL   Albumin 4.2 3.5 - 5.2 g/dL   GFR 84.69 >62.95 mL/min   Calcium 9.2 8.4 - 10.5 mg/dL  CBC with Differential/Platelet  Result Value Ref Range   WBC 3.4 (L) 4.0 - 10.5 K/uL   RBC 4.44 4.22 - 5.81 Mil/uL   Hemoglobin 15.5 13.0 - 17.0 g/dL   HCT 28.4 13.2 - 44.0 %   MCV 100.3 (H) 78.0 -  100.0 fl   MCHC 34.7 30.0 - 36.0 g/dL   RDW 10.2 72.5 - 36.6 %   Platelets 187.0 150.0 - 400.0 K/uL   Neutrophils Relative % 31.2 (L) 43.0 - 77.0 %   Lymphocytes Relative 55.8 Repeated and verified X2. (H) 12.0 - 46.0 %   Monocytes Relative 7.5 3.0 - 12.0 %   Eosinophils Relative 5.3 (H) 0.0 - 5.0 %   Basophils Relative 0.2 0.0 - 3.0 %   Neutro Abs 1.1 (L) 1.4 - 7.7 K/uL   Lymphs Abs 1.9 0.7 - 4.0 K/uL   Monocytes Absolute 0.3 0.1 - 1.0 K/uL   Eosinophils Absolute 0.2 0.0 - 0.7 K/uL   Basophils Absolute 0.0 0.0 - 0.1 K/uL  B12 and Folate Panel  Result Value Ref Range   Vitamin B-12 320 211 - 911 pg/mL   Folate 8.9 >5.9 ng/mL    Recent Results (from the past 2160 hour(s))  CBC     Status: Abnormal   Collection Time: 07/15/22 10:24 AM  Result Value Ref Range   WBC 3.7 (L) 4.0 - 10.5 K/uL   RBC 4.42 4.22 - 5.81 Mil/uL   Platelets 183.0 150.0 - 400.0 K/uL   Hemoglobin 15.5 13.0 - 17.0 g/dL   HCT 44.0 34.7 - 42.5 %   MCV 100.7 (H) 78.0 - 100.0 fl   MCHC 34.8 30.0 - 36.0 g/dL   RDW 95.6 38.7 - 56.4 %  Comp Met (CMET)     Status: Abnormal   Collection Time: 07/15/22 10:24 AM  Result Value Ref Range   Sodium 139 135 - 145 mEq/L   Potassium 4.2 3.5 - 5.1 mEq/L   Chloride 104 96 - 112 mEq/L   CO2 29 19 - 32 mEq/L   Glucose, Bld 87 70 - 99 mg/dL   BUN 11 6 - 23 mg/dL   Creatinine, Ser 3.32 0.40 - 1.50 mg/dL   Total Bilirubin 0.4 0.2 - 1.2 mg/dL   Alkaline Phosphatase 70 39 - 117 U/L   AST 51 (H) 0 - 37 U/L   ALT 139 (H) 0 - 53 U/L   Total Protein 7.0 6.0 - 8.3 g/dL   Albumin 4.4 3.5 - 5.2 g/dL   GFR 951.88 >41.66 mL/min    Comment: Calculated using the CKD-EPI Creatinine Equation (2021)   Calcium 9.2 8.4 - 10.5 mg/dL  Lipid panel  Status: Abnormal   Collection Time: 07/15/22 10:24 AM  Result Value Ref Range   Cholesterol 176 0 - 200 mg/dL    Comment: ATP III Classification       Desirable:  < 200 mg/dL               Borderline High:  200 - 239 mg/dL          High:  > =  161 mg/dL   Triglycerides 09.6 0.0 - 149.0 mg/dL    Comment: Normal:  <045 mg/dLBorderline High:  150 - 199 mg/dL   HDL 40.98 >11.91 mg/dL   VLDL 47.8 0.0 - 29.5 mg/dL   LDL Cholesterol 621 (H) 0 - 99 mg/dL   Total CHOL/HDL Ratio 4     Comment:                Men          Women1/2 Average Risk     3.4          3.3Average Risk          5.0          4.42X Average Risk          9.6          7.13X Average Risk          15.0          11.0                       NonHDL 134.98     Comment: NOTE:  Non-HDL goal should be 30 mg/dL higher than patient's LDL goal (i.e. LDL goal of < 70 mg/dL, would have non-HDL goal of < 100 mg/dL)  Hepatitis C Antibody     Status: None   Collection Time: 07/15/22 10:24 AM  Result Value Ref Range   Hepatitis C Ab NON-REACTIVE NON-REACTIVE    Comment: . HCV antibody was non-reactive. There is no laboratory  evidence of HCV infection. . In most cases, no further action is required. However, if recent HCV exposure is suspected, a test for HCV RNA (test code 30865) is suggested. . For additional information please refer to http://education.[facility].com/faq/FAQ22v1 (This link is being provided for informational/ educational purposes only.) .   HIV antibody (with reflex)     Status: None   Collection Time: 07/15/22 10:24 AM  Result Value Ref Range   HIV 1&2 Ab, 4th Generation NON-REACTIVE NON-REACTIVE    Comment: HIV-1 antigen and HIV-1/HIV-2 antibodies were not detected. There is no laboratory evidence of HIV infection. Marland Kitchen PLEASE NOTE: This information has been disclosed to you from records whose confidentiality may be protected by state law.  If your state requires such protection, then the state law prohibits you from making any further disclosure of the information without the specific written consent of the person to whom it pertains, or as otherwise permitted by law. A general authorization for the release of medical or other information is NOT  sufficient for this purpose. . For additional information please refer to http://education.[facility].com/faq/FAQ106 (This link is being provided for informational/ educational purposes only.) . Marland Kitchen The performance of this assay has not been clinically validated in patients less than 16 years old. .   Sedimentation rate     Status: None   Collection Time: 07/15/22 10:24 AM  Result Value Ref Range   Sed Rate 11 0 - 15 mm/hr  C-reactive protein     Status: None  Collection Time: 07/15/22 10:24 AM  Result Value Ref Range   CRP <1.0 0.5 - 20.0 mg/dL  WOUND CULTURE     Status: None   Collection Time: 07/25/22  1:16 PM   Specimen: Wound  Result Value Ref Range   MICRO NUMBER: [id number]    SPECIMEN QUALITY: Adequate    SOURCE: WOUND (SITE NOT SPECIFIED)    STATUS: FINAL    GRAM STAIN:      No white blood cells seen No epithelial cells seen Many Gram positive cocci in pairs   RESULT:      A mix of organisms of questionable significance was recovered on culture and not further identified. (Note: Growth did not detect the presence of S.aureus, beta-hemolytic Streptococci or P.aeruginosa).  Comp Met (CMET)     Status: Abnormal   Collection Time: 08/15/22  9:02 AM  Result Value Ref Range   Sodium 141 135 - 145 mEq/L   Potassium 4.2 3.5 - 5.1 mEq/L   Chloride 107 96 - 112 mEq/L   CO2 26 19 - 32 mEq/L   Glucose, Bld 95 70 - 99 mg/dL   BUN 13 6 - 23 mg/dL   Creatinine, Ser 1.91 0.40 - 1.50 mg/dL   Total Bilirubin 0.4 0.2 - 1.2 mg/dL   Alkaline Phosphatase 73 39 - 117 U/L   AST 29 0 - 37 U/L   ALT 65 (H) 0 - 53 U/L   Total Protein 6.9 6.0 - 8.3 g/dL   Albumin 4.2 3.5 - 5.2 g/dL   GFR 47.82 >95.62 mL/min    Comment: Calculated using the CKD-EPI Creatinine Equation (2021)   Calcium 9.2 8.4 - 10.5 mg/dL  CBC with Differential/Platelet     Status: Abnormal   Collection Time: 08/15/22  9:02 AM  Result Value Ref Range   WBC 3.4 (L) 4.0 - 10.5 K/uL   RBC 4.44 4.22 - 5.81 Mil/uL    Hemoglobin 15.5 13.0 - 17.0 g/dL   HCT 13.0 86.5 - 78.4 %   MCV 100.3 (H) 78.0 - 100.0 fl   MCHC 34.7 30.0 - 36.0 g/dL   RDW 69.6 29.5 - 28.4 %   Platelets 187.0 150.0 - 400.0 K/uL   Neutrophils Relative % 31.2 (L) 43.0 - 77.0 %   Lymphocytes Relative 55.8 Repeated and verified X2. (H) 12.0 - 46.0 %   Monocytes Relative 7.5 3.0 - 12.0 %   Eosinophils Relative 5.3 (H) 0.0 - 5.0 %   Basophils Relative 0.2 0.0 - 3.0 %   Neutro Abs 1.1 (L) 1.4 - 7.7 K/uL   Lymphs Abs 1.9 0.7 - 4.0 K/uL   Monocytes Absolute 0.3 0.1 - 1.0 K/uL   Eosinophils Absolute 0.2 0.0 - 0.7 K/uL   Basophils Absolute 0.0 0.0 - 0.1 K/uL  B12 and Folate Panel     Status: None   Collection Time: 08/15/22  9:02 AM  Result Value Ref Range   Vitamin B-12 320 211 - 911 pg/mL   Folate 8.9 >5.9 ng/mL    No image results found.   DG Foot 2 Views Left  Result Date: 07/29/2022 Please see detailed radiograph report in office note.  DG Foot 2 Views Left  Result Date: 07/16/2022 CLINICAL DATA:  Chronic foot pain after infection. Still draining motors. Strongly suspicious for osteomyelitis. Had a cut on bottom of fifth digit. Swelling and pain since then. EXAM: LEFT FOOT - 2 VIEW COMPARISON:  Left foot radiographs 03/21/2022 FINDINGS: No definite soft tissue ulcer is identified, with  attention to the fifth toe. No cortical erosion is seen. No subcutaneous air. Mild dorsal and minimal lateral great toe metatarsophalangeal degenerative spurring without joint space narrowing. Mild dorsal talonavicular degenerative osteophytosis. There is a 5 mm chronic well corticated ossicle dorsal to the talonavicular joint, unchanged. No acute fracture or dislocation. IMPRESSION: No definite soft tissue ulcer is identified, with attention to the fifth toe. No radiographic evidence of osteomyelitis. Electronically Signed   By: Neita Garnet M.D.   On: 07/16/2022 15:12       Signed: Lula Olszewski, MD 08/16/2022 8:09 AM

## 2022-08-16 ENCOUNTER — Encounter: Payer: Self-pay | Admitting: Internal Medicine

## 2022-08-16 DIAGNOSIS — F17200 Nicotine dependence, unspecified, uncomplicated: Secondary | ICD-10-CM | POA: Insufficient documentation

## 2022-08-16 NOTE — Assessment & Plan Note (Signed)
Identified as high-priority issue but seems chronic and less worrisome than LFT. Alcohol doesn't seem responsible. Collaboratively explored benefits/risks/costs of workup/treatment options: additional labwork recommended, and exploration of underlying cause, consider hematology pending diagnostic labwork  Patient understood risks of delaying proposed options and agreed to plan aligned with their goals/preferences.

## 2022-08-16 NOTE — Assessment & Plan Note (Addendum)
Chronic persistent unexplained - now associated with worsening LFT Hopefully celiac disease might explain, but other more serious possibilities could be present, close follow up is warranted. Identified as high-priority issue Collaboratively explored benefits/risks/costs of workup/treatment options: diagnostic labwork  Patient understood risks of delaying proposed options and agreed to plan aligned with their goals/preferences.

## 2022-08-16 NOTE — Assessment & Plan Note (Signed)
Seems to be improving Has Zofran available.

## 2022-08-16 NOTE — Assessment & Plan Note (Addendum)
Have encouraged as needed Pepcid complete. This problem was evaluated and confirmed to be present, but stable. We deferred addressing it at this time due to higher priority concerns discussed elsewhere in this document. We will plan follow up appointments to address it with ongoing continuity of care visits.

## 2022-08-16 NOTE — Patient Instructions (Signed)
It was a pleasure seeing you today! Your health and satisfaction are our top priorities.   Glenetta Hew, MD  Next Steps:  [x]  Early Intervention: Schedule sooner appointment, call our on-call services, or go to emergency room if there is Increase in pain or discomfort New or worsening symptoms Sudden or severe changes in your health [x]  Flexible Follow-Up: We recommend a No follow-ups on file. for optimal routine care. This allows for progress monitoring and treatment adjustments. [x]  Preventive Care: Schedule your annual preventive care visit! It's typically covered by insurance and helps identify potential health issues early. [x]  Lab & X-ray Appointments: Incomplete tests scheduled today, or call to schedule. X-rays: [facility] (M-F, 8:30am-noon or 1pm-5pm). [x]  Medical Information Release: Sign a release form at front desk to obtain relevant medical information we don't have.  Making the Most of Our Focused (20 minute) Appointments:  [x]   Clearly state your top concerns at the beginning of the visit to focus our discussion [x]   If you anticipate you will need more time, please inform the front desk during scheduling - we can book multiple appointments in the same week. [x]   If you have transportation problems- use our convenient video appointments or ask about transportation support. [x]   We can get down to business faster if you use MyChart to update information before the visit and submit non-urgent questions before your visit. Thank you for taking the time to provide details through MyChart.  Let our nurse know and she can import this information into your encounter documents.  Arrival and Wait Times: [x]   Arriving on time ensures that everyone receives prompt attention. [x]   Early morning (8a) and afternoon (1p) appointments tend to have shortest wait times. [x]   Unfortunately, we cannot delay appointments for late arrivals or hold slots during phone  calls.  Getting Answers and Following Up  [x]   Simple Questions & Concerns: For quick questions or basic follow-up after your visit, reach [place] at [phone] or MyChart messaging. [x]   Complex Concerns: If your concern is more complex, scheduling an appointment might be best. Discuss this with the staff to find the most suitable option. [x]   Lab & Imaging Results: We'll contact you directly if results are abnormal or you don't use MyChart. Most normal results will be on MyChart within 2-3 business days, with a review message from Dr. Jon Billings. Haven't heard back in 2 weeks? Need results sooner? Contact us at [phone]. [x]   Referrals: Our referral coordinator will manage specialist referrals. The specialist's office should contact you within 2 weeks to schedule an appointment. Call us if you haven't heard from them after 2 weeks.  Staying Connected  [x]   MyChart: Activate your MyChart for the fastest way to access results and message [place]. See the last page of this paperwork for instructions on how to activate.  Bring to Your Next Appointment  [x]   Medications: Please bring all your medication bottles to your next appointment to ensure we have an accurate record of your prescriptions. [x]   Health Diaries: If you're monitoring any health conditions at home, keeping a diary of your readings can be very helpful for discussions at your next appointment.  Billing  [x]   X-ray & Lab Orders: These are billed by separate companies. Contact the invoicing company directly for questions or concerns. [x]   Visit Charges: Discuss any billing inquiries with our administrative services team.  Your Satisfaction Matters  [x]   Share Your Experience: We strive for your satisfaction!  If you have any complaints, or preferably compliments, please let Dr. Jon Billings know directly or contact our Practice Administrators, Edwena Felty or [employer], by asking at the front desk.   Reviewing Your Records  [x]    Review this early draft of your clinical encounter notes below and the final encounter summary tomorrow on MyChart after its been completed.   Elevated LFTs Assessment & Plan: Thoroughly reviewed with patient this issue which seems to be worsening with no apparent cause Gastrointestinal referral placed 06/2022, advised patient to call [facility] to make it happen Cause of worsening LFT not obvious  Will get more labs to investigate cause Will get ultrasound   Orders: -     Comprehensive metabolic panel -     Hepatitis panel, acute -     Mitochondrial antibodies -     Anti-smooth muscle antibody, IgG -     AntiMicrosomal Ab-Liver / Kidney -     US Abdomen Complete; Future -     ANA -     Celiac Disease Comprehensive Panel with Reflexes  Leukopenia, unspecified 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 Identified as high-priority issue Collaboratively explored benefits/risks/costs of workup/treatment options: diagnostic labwork  Patient understood risks of delaying proposed options and agreed to plan aligned with their goals/preferences.       Orders: -     Methylmalonic acid, serum -     Protein electrophoresis, serum -     Pathologist smear review -     HIV Antibody (routine testing w rflx) -     CBC with Differential/Platelet -     B12 and Folate Panel -     Celiac Disease Comprehensive Panel with Reflexes  Macrocytosis without anemia Assessment & Plan: Identified as high-priority issue but seems chronic and less worrisome than LFT. Alcohol doesn't seem responsible. Collaboratively explored benefits/risks/costs of workup/treatment options: additional labwork recommended, and exploration of underlying cause, consider hematology pending diagnostic labwork  Patient understood risks of  delaying proposed options and agreed to plan aligned with their goals/preferences.    Orders: -     Protein electrophoresis, serum -     Pathologist smear review -     HIV Antibody (routine testing w rflx) -     CBC with Differential/Platelet -     B12 and Folate Panel  Loss of weight Assessment & Plan: Resolving/not losing weight anymore Previous weight loss reviewed- will resolve this as a problem but leave on pmh Encouraged patient to  to actually try to lose weight. Probably was due to whatever going on with liver and bowel which still needs further investigation  Orders: -     Celiac Disease Comprehensive Panel with Reflexes  Prediabetes  Chronic pain of left knee Assessment & Plan: This problem was evaluated and confirmed to be present, but stable. We deferred addressing it at this time due to higher priority concerns discussed elsewhere in this document. We will plan follow up appointments to address it with ongoing continuity of care visits.    Nausea Assessment & Plan: Seems to be improving Has Zofran available.  Orders: -     Ondansetron HCl; Take 1 tablet (4 mg total) by mouth every 8 (eight) hours as needed for nausea or vomiting.  Dispense: 20 tablet; Refill: 0  Loose bowel movement -     Celiac Disease Comprehensive  Panel with Reflexes  Gastroesophageal reflux disease without esophagitis Assessment & Plan: Have encouraged as needed Pepcid complete. This problem was evaluated and confirmed to be present, but stable. We deferred addressing it at this time due to higher priority concerns discussed elsewhere in this document. We will plan follow up appointments to address it with ongoing continuity of care visits.    Obesity due to energy imbalance Assessment & Plan: Was losing weight with illness but now gaining again Encouraged weight loss

## 2022-08-16 NOTE — Assessment & Plan Note (Addendum)
This problem was evaluated and confirmed to be present, but stable. We deferred addressing it at this time due to higher priority concerns discussed elsewhere in this document. We will plan follow up appointments to address it with ongoing continuity of care visits.

## 2022-08-16 NOTE — Assessment & Plan Note (Signed)
Was losing weight with illness but now gaining again Encouraged weight loss

## 2022-08-19 LAB — CELIAC DISEASE COMPREHENSIVE PANEL WITH REFLEXES: (tTG) Ab, IgA: <1 U/mL

## 2022-08-21 ENCOUNTER — Encounter: Payer: Self-pay | Admitting: Internal Medicine

## 2022-08-21 LAB — ANA: Anti Nuclear Antibody (ANA): NEGATIVE

## 2022-08-21 LAB — PROTEIN ELECTROPHORESIS, SERUM
Albumin ELP: 4.3 g/dL (ref 3.8–4.8)
Alpha 1: 0.2 g/dL (ref 0.2–0.3)
Alpha 2: 0.6 g/dL (ref 0.5–0.9)
Beta 2: 0.5 g/dL (ref 0.2–0.5)
Beta Globulin: 0.4 g/dL (ref 0.4–0.6)
Gamma Globulin: 1.1 g/dL (ref 0.8–1.7)
Total Protein: 7 g/dL (ref 6.1–8.1)

## 2022-08-21 LAB — CELIAC DISEASE COMPREHENSIVE PANEL WITH REFLEXES: Immunoglobulin A: 287 mg/dL (ref 47–310)

## 2022-08-21 LAB — ANTI-MICROSOMAL ANTIBODY LIVER / KIDNEY: LKM1 Ab: <=20 U (ref <=20.0)

## 2022-08-21 LAB — METHYLMALONIC ACID, SERUM: Methylmalonic Acid, Quant: 88 nmol/L (ref 87–318)

## 2022-08-21 LAB — HEPATITIS PANEL, ACUTE
Hep A IgM: NONREACTIVE
Hep B C IgM: NONREACTIVE
Hepatitis B Surface Ag: NONREACTIVE
Hepatitis C Ab: NONREACTIVE

## 2022-08-21 LAB — PATHOLOGIST SMEAR REVIEW

## 2022-08-21 LAB — HIV ANTIBODY (ROUTINE TESTING W REFLEX): HIV 1&2 Ab, 4th Generation: NONREACTIVE

## 2022-08-21 LAB — MITOCHONDRIAL ANTIBODIES: Mitochondrial M2 Ab, IgG: <=20 U (ref <=20.0)

## 2022-08-21 LAB — ANTI-SMOOTH MUSCLE ANTIBODY, IGG: Actin (Smooth Muscle) Antibody (IGG): <20 U (ref <20)

## 2022-08-21 NOTE — Progress Notes (Signed)
Please place any orders needed / requested (hematology referral for leucopenia). Arrange/confirm follow up appt(s).

## 2022-08-22 ENCOUNTER — Other Ambulatory Visit (HOSPITAL_COMMUNITY): Payer: Self-pay

## 2022-08-29 NOTE — Addendum Note (Signed)
Addended by: Candie Chroman on: 08/29/2022 02:57 PM   Modules accepted: Orders

## 2022-09-02 ENCOUNTER — Ambulatory Visit
Admission: RE | Admit: 2022-09-02 | Discharge: 2022-09-02 | Disposition: A | Discharge status: Home / Self Care | Payer: BC Managed Care – PPO | Source: Ambulatory Visit | Attending: Internal Medicine | Admitting: Internal Medicine

## 2022-09-02 DIAGNOSIS — G8929 Other chronic pain: Secondary | ICD-10-CM

## 2022-09-02 DIAGNOSIS — L089 Local infection of the skin and subcutaneous tissue, unspecified: Secondary | ICD-10-CM

## 2022-09-02 MED ORDER — GADOPICLENOL 0.5 MMOL/ML IV SOLN
10.0000 mL | Freq: Once | INTRAVENOUS | Status: AC | PRN
  Administered 2022-09-02: 10 mL via INTRAVENOUS

## 2022-09-05 ENCOUNTER — Inpatient Hospital Stay: Payer: BC Managed Care – PPO | Admitting: Family

## 2022-09-05 ENCOUNTER — Inpatient Hospital Stay: Payer: BC Managed Care – PPO | Attending: Hematology & Oncology

## 2022-09-05 ENCOUNTER — Other Ambulatory Visit: Payer: Self-pay | Admitting: Family

## 2022-09-05 DIAGNOSIS — D72819 Decreased white blood cell count, unspecified: Secondary | ICD-10-CM | POA: Insufficient documentation

## 2022-09-05 DIAGNOSIS — F1290 Cannabis use, unspecified, uncomplicated: Secondary | ICD-10-CM | POA: Insufficient documentation

## 2022-09-05 DIAGNOSIS — F1090 Alcohol use, unspecified, uncomplicated: Secondary | ICD-10-CM | POA: Insufficient documentation

## 2022-09-05 DIAGNOSIS — F17210 Nicotine dependence, cigarettes, uncomplicated: Secondary | ICD-10-CM | POA: Insufficient documentation

## 2022-09-12 ENCOUNTER — Inpatient Hospital Stay: Admission: RE | Admit: 2022-09-12 | Payer: BC Managed Care – PPO | Source: Ambulatory Visit

## 2022-09-13 NOTE — Progress Notes (Signed)
Tiffany, please confirm patient has follow up scheduled with Dr. Charlsie Merles, and that this MRI is available to him. I reviewed the results of this test.  I correlated the findings with the patient's recent history and presenting complaints of possible foot/toe infection The findings were consistent with the patient's diagnosis of osteoarthritis, and do not support a diagnosis of osteomyelitis... although I wouldn't say that its totally ruled out. An explanation of the results and any necessary follow-up plan have been communicated to the patient via MyChart.- I would like him to have an appointment with Cristie Hem to review the findings.

## 2022-09-16 ENCOUNTER — Ambulatory Visit
Admission: RE | Admit: 2022-09-16 | Discharge: 2022-09-16 | Disposition: A | Discharge status: Home / Self Care | Payer: BC Managed Care – PPO | Source: Ambulatory Visit | Attending: Internal Medicine | Admitting: Internal Medicine

## 2022-09-16 DIAGNOSIS — R7989 Other specified abnormal findings of blood chemistry: Secondary | ICD-10-CM

## 2022-09-17 NOTE — Progress Notes (Signed)
Shelly, Please schedule for this patient to see me

## 2022-09-17 NOTE — Progress Notes (Signed)
Subject: Follow Up Needed - Liver Ultrasound & Blood Test Results  Dear Juan Perkins,  This message is about your recent ultrasound and blood tests. We'd like to discuss the findings and next steps.  Ultrasound:  The scan showed increased echogenicity in your liver, which could indicate fatty liver disease (hepatic steatosis). Other organs, like your gallbladder, bile ducts, pancreas, spleen, and kidneys, appeared normal.  Blood Tests: Your liver function tests, especially ALT, have been high for some time. This suggests possible ongoing liver damage. Other tests, like kidney function and blood counts, were mostly normal.  Next Steps: We recommend seeing a liver specialist (gastroenterologist) to further assess your liver health. Referral was made in April and should be scheduled by now- please call our office if this has not happened. Depending on your symptoms, we might need an additional test (MRCP or ERCP) to check for bile duct blockage. Please schedule an appointment to discuss these findings and next steps in more detail.  Additional Information: You can find more details about your test results in MyChart under "Results."  You can also schedule an appointment with [place] there. We understand this might be concerning. We're here to support you and ensure you receive proper care.  Sincerely,  Juan Olszewski, MD  09/17/2022 8:45 PM   [facility]:  [phone]

## 2022-09-19 ENCOUNTER — Inpatient Hospital Stay (HOSPITAL_BASED_OUTPATIENT_CLINIC_OR_DEPARTMENT_OTHER): Payer: BC Managed Care – PPO | Admitting: Family

## 2022-09-19 ENCOUNTER — Encounter: Payer: Self-pay | Admitting: Family

## 2022-09-19 ENCOUNTER — Other Ambulatory Visit: Payer: Self-pay

## 2022-09-19 ENCOUNTER — Encounter: Payer: Self-pay | Admitting: Podiatry

## 2022-09-19 ENCOUNTER — Inpatient Hospital Stay: Payer: BC Managed Care – PPO

## 2022-09-19 ENCOUNTER — Ambulatory Visit (INDEPENDENT_AMBULATORY_CARE_PROVIDER_SITE_OTHER): Payer: BC Managed Care – PPO | Admitting: Podiatry

## 2022-09-19 VITALS — BP 111/70 | HR 67 | Temp 97.9°F | Resp 16

## 2022-09-19 DIAGNOSIS — D72819 Decreased white blood cell count, unspecified: Secondary | ICD-10-CM

## 2022-09-19 DIAGNOSIS — F17210 Nicotine dependence, cigarettes, uncomplicated: Secondary | ICD-10-CM | POA: Diagnosis not present

## 2022-09-19 DIAGNOSIS — F1090 Alcohol use, unspecified, uncomplicated: Secondary | ICD-10-CM

## 2022-09-19 DIAGNOSIS — F1290 Cannabis use, unspecified, uncomplicated: Secondary | ICD-10-CM

## 2022-09-19 DIAGNOSIS — M79672 Pain in left foot: Secondary | ICD-10-CM | POA: Diagnosis not present

## 2022-09-19 LAB — CBC WITH DIFFERENTIAL (CANCER CENTER ONLY)
Abs Immature Granulocytes: 0.01 10*3/uL (ref 0.00–0.07)
Basophils Absolute: 0 10*3/uL (ref 0.0–0.1)
Basophils Relative: 1 %
Eosinophils Absolute: 0.1 10*3/uL (ref 0.0–0.5)
Eosinophils Relative: 5 %
HCT: 42.6 % (ref 39.0–52.0)
Hemoglobin: 15 g/dL (ref 13.0–17.0)
Immature Granulocytes: 0 %
Lymphocytes Relative: 53 %
Lymphs Abs: 1.5 10*3/uL (ref 0.7–4.0)
MCH: 34.7 pg — ABNORMAL HIGH (ref 26.0–34.0)
MCHC: 35.2 g/dL (ref 30.0–36.0)
MCV: 98.6 fL (ref 80.0–100.0)
Monocytes Absolute: 0.2 10*3/uL (ref 0.1–1.0)
Monocytes Relative: 7 %
Neutro Abs: 1 10*3/uL — ABNORMAL LOW (ref 1.7–7.7)
Neutrophils Relative %: 34 %
Platelet Count: 158 10*3/uL (ref 150–400)
RBC: 4.32 MIL/uL (ref 4.22–5.81)
RDW: 11.8 % (ref 11.5–15.5)
WBC Count: 2.9 10*3/uL — ABNORMAL LOW (ref 4.0–10.5)
nRBC: 0 % (ref 0.0–0.2)

## 2022-09-19 LAB — CMP (CANCER CENTER ONLY)
ALT: 63 U/L — ABNORMAL HIGH (ref 0–44)
AST: 28 U/L (ref 15–41)
Albumin: 4.4 g/dL (ref 3.5–5.0)
Alkaline Phosphatase: 62 U/L (ref 38–126)
Anion gap: 6 (ref 5–15)
BUN: 11 mg/dL (ref 6–20)
CO2: 28 mmol/L (ref 22–32)
Calcium: 9.3 mg/dL (ref 8.9–10.3)
Chloride: 105 mmol/L (ref 98–111)
Creatinine: 1.04 mg/dL (ref 0.61–1.24)
GFR, Estimated: >60 mL/min (ref >60)
Glucose, Bld: 159 mg/dL — ABNORMAL HIGH (ref 70–99)
Potassium: 4.4 mmol/L (ref 3.5–5.1)
Sodium: 139 mmol/L (ref 135–145)
Total Bilirubin: 0.6 mg/dL (ref 0.3–1.2)
Total Protein: 6.8 g/dL (ref 6.5–8.1)

## 2022-09-19 LAB — LACTATE DEHYDROGENASE: LDH: 129 U/L (ref 98–192)

## 2022-09-19 LAB — SAVE SMEAR(SSMR), FOR PROVIDER SLIDE REVIEW

## 2022-09-19 NOTE — Progress Notes (Unsigned)
Hematology/Oncology Consultation   Name: Juan Perkins      MRN: [id number]    Location: Room/bed info not found  Date: 09/19/2022 Time:11:11 AM   REFERRING PHYSICIAN: Glenetta Hew, MD  REASON FOR CONSULT:  Leukopenia    DIAGNOSIS: Mild chronic leukopenia   HISTORY OF PRESENT ILLNESS:  Juan Perkins is a pleasant 27 yo caucasian gentleman with greater than 9 year history of intermittent mild leukopenia.  He denies any issues with frequent or recurrent infections.  No fever, chills, n/v, cough, rash, dizziness, SOB, chest pain, palpitations, abdominal pain or changes in bowel (questionable IBS-D plans to discuss with PCP) or bladder habits.  Overall he is doing well and has no complaints at this time.  He states that his liver enzymes were recently elevated and he has made some lifestyle changes to help with this.  US of the abdomen on 09/16/2022 showed hepatic steatosis with common bile duct on upper limits of normal size.   He has puffiness in his hands and feet that comes and goes. Hands seem random in occurrence but feet correlates with salty and greasy food intake.  No personal history of diabetes or thyroid disease. Father has diabetes.  No personal or known familial history of cancer.  No history of surgery.  He does smoke about 5 cigarettes a day and cannabis daily.  He has significantly cut back on his ETOH intake. He has 1 liquor drink a week. Appetite is good but he admits that he needs to better hydrate throughout the day.  His weight is stable at 241 lbs.  He works in a [employer] and has a very physical job.   ROS: All other 10 point review of systems is negative.   PAST MEDICAL HISTORY:   Past Medical History:  Diagnosis Date   ADHD (attention deficit hyperactivity disorder) 07/14/2001   dx from [facility]now [facility] records; on Concerta, then Adderall XR to 4.08, then back to Concerta until 7.09   Anxiety    Asthma 02/13/2009   from [facility] records; one note with rx for albuterol and sample of  singulair    Attention deficit hyperactivity disorder (ADHD) 07/15/2022   Was on Vyvanse not sure if wants to resume price issues   Chronic pain of toe 07/15/2022   Was told it was fractured at the ED around November or December of 2023. Has a odor and the skin is split underneath for the past two weeks, did hit toe on an object around that time that caused bleeding. Antibiotic(s) ointment didn't really do anything. Pain is associated with tenderness in the metatarsophalangeal joint  X-ray done in the emergency room was reviewed with the patient on April 16   Foot infection 07/21/2022   Photographs Taken Today:     27 year old male first noticed a little dot sometime last week on the bottom of his left pinky toe and then over the last week or so he has noticed worsening pain on the lateral aspect of his proximal pinky toe but no pain anywhere else.  However he does have colored skin changes over his top of his forefoot on the left but there is no pain or tenderness with the spots   GERD (gastroesophageal reflux disease) 07/15/2022   Recommend Pepcid complete as needed only   3. Overweight: - Dx: Assessment of current diet, physical activity level, and potential metabolic contributors. - Tx: Structured weight loss program focusing on diet, exercise, and behavioral  changes. Consider referral to a dietitian.  4. Anxiety and ADHD: - Dx: Review current treatment efficacy and side effects. - Tx: Consider psychiatric consul   Leucopenia 07/15/2022   Lab Results Component Value Date/Time  WBC 3.7 (L) 07/15/2022 10:24 AM  WBC 3.0 (L) 10/22/2021 11:01 AM  WBC 4.4 06/04/2021 04:01 PM  WBC 3.0 (L) 07/21/2016 12:33 PM  WBC 3.4 (L) 09/11/2015 10:17 AM  Lab Results Component Value Date/Time  [id number] 07/21/2016 12:33 PM  Lab Results Component Value Date/Time  ALT 139 (H) 07/15/2022 10:24 AM  ALT 92  (H) 10/22/2021 11:01 AM  ALT 6   Loss of weight 12/29/2012   Increased body mass noted.  Body mass index is 33.01 kg/m.  Well proportioned with no abnormal fat distribution.  Good muscle tone.         Lab Results  Component  Value  Date     TSH  1.38  10/22/2021         Lab Results  Component  Value  Date     HGBA1C  5.8  10/22/2021            Wt Readings from Last 10 Encounters:  08/15/22  243 lb 6.4 oz (110.4 kg)  07/21/22  245 lb (111.1 kg)  07/15/22  2    ALLERGIES: Allergies  Allergen Reactions   Other     FRESH FRUIT      MEDICATIONS:  Current Outpatient Medications on File Prior to Visit  Medication Sig Dispense Refill   amoxicillin-clavulanate (AUGMENTIN) 875-125 MG tablet Take 1 tablet by mouth 2 (two) times daily. 20 tablet 0   diclofenac (VOLTAREN) 75 MG EC tablet Take 1 tablet (75 mg) by mouth 2 (two) times daily. 60 tablet 0   diclofenac Sodium (VOLTAREN) 1 % GEL Apply 4 grams topically 4 times daily as needed. 100 g 3   doxycycline (VIBRA-TABS) 100 MG tablet Take 1 tablet (100 mg total) by mouth 2 (two) times daily. 20 tablet 0   famotidine-calcium carbonate-magnesium hydroxide (PEPCID COMPLETE) 10-800-165 MG chewable tablet Chew 1 tablet by mouth daily as needed. 100 tablet 11   ketorolac (TORADOL) 10 MG tablet Take 1 tablet (10 mg total) by mouth every 6 (six) hours as needed for severe or moderate pain. Do not take with ibuprofen, naproxen, or diclofenac.  Take with food.  May need to take heartburn medications. For severe pain-5 day duration maximum. 20 tablet 0   methocarbamol (ROBAXIN) 500 MG tablet Take 1 tablet (500 mg total) by mouth every 8 (eight) hours as needed for muscle spasms. 60 tablet 0   ondansetron (ZOFRAN) 4 MG tablet Take 1 tablet (4 mg total) by mouth every 8 (eight) hours as needed for nausea or vomiting. 20 tablet 0   valACYclovir (VALTREX) 500 MG tablet Take 1 tablet (500 mg total) by mouth daily. 90 tablet 3   No current facility-administered  medications on file prior to visit.     PAST SURGICAL HISTORY No past surgical history on file.  FAMILY HISTORY: Family History  Problem Relation Age of Onset   Diabetes Paternal Grandfather    Hypertension Paternal Grandfather    Diabetes Father    Hypertension Father    Hypertension Maternal Grandmother     SOCIAL HISTORY:  reports that he has been smoking cigarettes. He has a 2.50 pack-year smoking history. He has been exposed to tobacco smoke. He has never used smokeless tobacco. He reports current alcohol use of about 14.0 standard  drinks of alcohol per week. He reports current drug use. Frequency: 5.00 times per week. Drug: Marijuana.  PERFORMANCE STATUS: The patient's performance status is 0 - Asymptomatic  PHYSICAL EXAM: Most Recent Vital Signs: Blood pressure 111/70, pulse 67, temperature 97.9 F (36.6 C), temperature source Oral, resp. rate 16. BP 111/70 (BP Location: Left Arm, Patient Position: Sitting)   Pulse 67   Temp 97.9 F (36.6 C) (Oral)   Resp 16   General Appearance:    Alert, cooperative, no distress, appears stated age  Head:    Normocephalic, without obvious abnormality, atraumatic  Eyes:    PERRL, conjunctiva/corneas clear, EOM's intact, fundi    benign, both eyes             Throat:   Lips, mucosa, and tongue normal; teeth and gums normal  Neck:   Supple, symmetrical, trachea midline, no adenopathy;       thyroid:  No enlargement/tenderness/nodules; no carotid   bruit or JVD  Back:     Symmetric, no curvature, ROM normal, no CVA tenderness  Lungs:     Clear to auscultation bilaterally, respirations unlabored  Chest wall:    No tenderness or deformity  Heart:    Regular rate and rhythm, S1 and S2 normal, no murmur, rub   or gallop  Abdomen:     Soft, non-tender, bowel sounds active all four quadrants,    no masses, no organomegaly        Extremities:   Extremities normal, atraumatic, no cyanosis or edema  Pulses:   2+ and symmetric all  extremities  Skin:   Skin color, texture, turgor normal, no rashes or lesions  Lymph nodes:   Cervical, supraclavicular, and axillary nodes normal  Neurologic:   CNII-XII intact. Normal strength, sensation and reflexes      throughout    LABORATORY DATA:  Results for orders placed or performed in visit on 09/19/22 (from the past 48 hour(s))  CBC with Differential (Cancer Center Only)     Status: Abnormal   Collection Time: 09/19/22 10:24 AM  Result Value Ref Range   WBC Count 2.9 (L) 4.0 - 10.5 K/uL   RBC 4.32 4.22 - 5.81 MIL/uL   Hemoglobin 15.0 13.0 - 17.0 g/dL   HCT 96.2 95.2 - 84.1 %   MCV 98.6 80.0 - 100.0 fL   MCH 34.7 (H) 26.0 - 34.0 pg   MCHC 35.2 30.0 - 36.0 g/dL   RDW 32.4 40.1 - 02.7 %   Platelet Count 158 150 - 400 K/uL   nRBC 0.0 0.0 - 0.2 %   Neutrophils Relative % 34 %   Neutro Abs 1.0 (L) 1.7 - 7.7 K/uL   Lymphocytes Relative 53 %   Lymphs Abs 1.5 0.7 - 4.0 K/uL   Monocytes Relative 7 %   Monocytes Absolute 0.2 0.1 - 1.0 K/uL   Eosinophils Relative 5 %   Eosinophils Absolute 0.1 0.0 - 0.5 K/uL   Basophils Relative 1 %   Basophils Absolute 0.0 0.0 - 0.1 K/uL   Immature Granulocytes 0 %   Abs Immature Granulocytes 0.01 0.00 - 0.07 K/uL    Comment: Performed at [facility] at [facility], [address] [place], [place] [address]  CMP (Cancer Center only)     Status: Abnormal   Collection Time: 09/19/22 10:24 AM  Result Value Ref Range   Sodium 139 135 - 145 mmol/L   Potassium 4.4 3.5 - 5.1 mmol/L  Chloride 105 98 - 111 mmol/L   CO2 28 22 - 32 mmol/L   Glucose, Bld 159 (H) 70 - 99 mg/dL    Comment: Glucose reference range applies only to samples taken after fasting for at least 8 hours.   BUN 11 6 - 20 mg/dL   Creatinine 1.61 0.96 - 1.24 mg/dL   Calcium 9.3 8.9 - 04.5 mg/dL   Total Protein 6.8 6.5 - 8.1 g/dL   Albumin 4.4 3.5 - 5.0 g/dL   AST 28 15 - 41 U/L   ALT 63 (H) 0 - 44 U/L   Alkaline Phosphatase 62 38 - 126 U/L    Total Bilirubin 0.6 0.3 - 1.2 mg/dL   GFR, Estimated >40 >98 mL/min    Comment: (NOTE) Calculated using the CKD-EPI Creatinine Equation (2021)    Anion gap 6 5 - 15    Comment: Performed at [facility] at [facility], [address] [place], [place] [address]  Save Smear for Provider Slide Review     Status: None   Collection Time: 09/19/22 10:24 AM  Result Value Ref Range   Smear Review SMEAR STAINED AND AVAILABLE FOR REVIEW     Comment: Performed at [facility] at [facility], [address] [place], [place] [address]  Lactate dehydrogenase (LDH)     Status: None   Collection Time: 09/19/22 10:25 AM  Result Value Ref Range   LDH 129 98 - 192 U/L    Comment: Performed at [facility] at [facility], [address], [place], [place] [address]      RADIOGRAPHY: No results found.     PATHOLOGY: None  ASSESSMENT/PLAN: Mr. Simington is a pleasant 27 yo caucasian gentleman with greater than 9 year history of intermittent mild leukopenia.     All questions were answered. The patient knows to call the clinic with any problems, questions or concerns. We can certainly see the patient much sooner if necessary.  The patient was discussed with Dr. Myna Hidalgo and he is in agreement with the aforementioned.   Eileen Stanford, NP

## 2022-09-20 NOTE — Progress Notes (Signed)
Subjective:   Patient ID: Juan Perkins, male   DOB: 27 y.o.   MRN: [id number]   HPI Patient states and doing well between the fourth and fifth digits left foot but I had an MRI ordered by my family physician who wanted it reviewed   ROS      Objective:  Physical Exam  Neuro vascular status intact significant improvement between the fourth and fifth digits left lesion resolved currently     Assessment:  Clinically doing very well post inflammatory condition with lesion formation fourth interspace left     Plan:  8 H&P done reviewed MRI indicating no signs of osteomyelitic bone changes within the fourth fifth digits and I am satisfied right now everything is under control and still may require surgery but we will hold off currently

## 2023-02-16 ENCOUNTER — Ambulatory Visit: Payer: BLUE CROSS/BLUE SHIELD | Admitting: Internal Medicine

## 2023-07-09 ENCOUNTER — Encounter (HOSPITAL_BASED_OUTPATIENT_CLINIC_OR_DEPARTMENT_OTHER): Payer: Self-pay | Admitting: Emergency Medicine

## 2023-07-09 ENCOUNTER — Emergency Department (HOSPITAL_BASED_OUTPATIENT_CLINIC_OR_DEPARTMENT_OTHER)

## 2023-07-09 ENCOUNTER — Emergency Department (HOSPITAL_BASED_OUTPATIENT_CLINIC_OR_DEPARTMENT_OTHER)
Admission: EM | Admit: 2023-07-09 | Discharge: 2023-07-09 | Disposition: A | Discharge status: Home / Self Care | Attending: Emergency Medicine | Admitting: Emergency Medicine

## 2023-07-09 DIAGNOSIS — K610 Anal abscess: Secondary | ICD-10-CM | POA: Diagnosis present

## 2023-07-09 DIAGNOSIS — L988 Other specified disorders of the skin and subcutaneous tissue: Secondary | ICD-10-CM

## 2023-07-09 LAB — BASIC METABOLIC PANEL WITH GFR
Anion gap: 25 — ABNORMAL HIGH (ref 5–15)
BUN: 13 mg/dL (ref 6–20)
CO2: 26 mmol/L (ref 22–32)
Calcium: 8.8 mg/dL — ABNORMAL LOW (ref 8.9–10.3)
Chloride: 99 mmol/L (ref 98–111)
Creatinine, Ser: 1 mg/dL (ref 0.61–1.24)
GFR, Estimated: >60 mL/min (ref >60)
Glucose, Bld: 79 mg/dL (ref 70–99)
Potassium: 3.8 mmol/L (ref 3.5–5.1)
Sodium: 150 mmol/L — ABNORMAL HIGH (ref 135–145)

## 2023-07-09 LAB — CBC WITH DIFFERENTIAL/PLATELET
Abs Immature Granulocytes: 0.02 10*3/uL (ref 0.00–0.07)
Basophils Absolute: 0 10*3/uL (ref 0.0–0.1)
Basophils Relative: 0 %
Eosinophils Absolute: 0.3 10*3/uL (ref 0.0–0.5)
Eosinophils Relative: 4 %
HCT: 39.8 % (ref 39.0–52.0)
Hemoglobin: 13.8 g/dL (ref 13.0–17.0)
Immature Granulocytes: 0 %
Lymphocytes Relative: 26 %
Lymphs Abs: 2.2 10*3/uL (ref 0.7–4.0)
MCH: 34.6 pg — ABNORMAL HIGH (ref 26.0–34.0)
MCHC: 34.7 g/dL (ref 30.0–36.0)
MCV: 99.7 fL (ref 80.0–100.0)
Monocytes Absolute: 0.7 10*3/uL (ref 0.1–1.0)
Monocytes Relative: 8 %
Neutro Abs: 5.1 10*3/uL (ref 1.7–7.7)
Neutrophils Relative %: 62 %
Platelets: 195 10*3/uL (ref 150–400)
RBC: 3.99 MIL/uL — ABNORMAL LOW (ref 4.22–5.81)
RDW: 12 % (ref 11.5–15.5)
WBC: 8.3 10*3/uL (ref 4.0–10.5)
nRBC: 0 % (ref 0.0–0.2)

## 2023-07-09 MED ORDER — METRONIDAZOLE 500 MG PO TABS
500.0000 mg | ORAL_TABLET | Freq: Once | ORAL | Status: AC
  Administered 2023-07-09: 500 mg via ORAL
  Filled 2023-07-09: qty 1

## 2023-07-09 MED ORDER — METRONIDAZOLE 500 MG PO TABS: 500.0000 mg | ORAL_TABLET | Freq: Two times a day (BID) | ORAL | 0 refills | Status: AC

## 2023-07-09 MED ORDER — CIPROFLOXACIN HCL 500 MG PO TABS: 500.0000 mg | ORAL_TABLET | Freq: Two times a day (BID) | ORAL | 0 refills | Status: AC

## 2023-07-09 MED ORDER — CIPROFLOXACIN HCL 500 MG PO TABS
500.0000 mg | ORAL_TABLET | Freq: Once | ORAL | Status: AC
  Administered 2023-07-09: 500 mg via ORAL
  Filled 2023-07-09: qty 1

## 2023-07-09 MED ORDER — OXYCODONE-ACETAMINOPHEN 5-325 MG PO TABS
1.0000 | ORAL_TABLET | Freq: Once | ORAL | Status: AC
  Administered 2023-07-09: 1 via ORAL
  Filled 2023-07-09: qty 1

## 2023-07-09 NOTE — Discharge Instructions (Addendum)
 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  Antibiotics sent to pharmacy.

## 2023-07-09 NOTE — ED Notes (Signed)
 Abscess, currently draining prior to arrival, right buttock, pain rated 6/10 and ongoing since Sat.

## 2023-07-09 NOTE — ED Triage Notes (Signed)
 Pt c/o abscess on buttocks that pt reports has ruptured, with foul odor

## 2023-07-09 NOTE — ED Provider Notes (Signed)
 [facility] AT [facility] Provider Note   CSN: [id number] Arrival date & time: 07/09/23  1819     History  Chief Complaint  Patient presents with   Abscess    Juan Perkins is a 28 y.o. male.  Patient is a 28 year old male presenting for concerns of perianal abscess.  States that he has had 1 in the past several years ago that resolved with antibiotics.  Patient states this current 1 has been ongoing for the past several days.  He has been applying heat and thinks it spontaneously ruptured due to purulent drainage and foul odor.  Coming in for evaluation for further incision and drainage and consideration for antibiotics.  He denies fevers, chills, nausea, vomiting.  The history is provided by the patient. No language interpreter was used.  Abscess Associated symptoms: no fever and no vomiting        Home Medications Prior to Admission medications   Medication Sig Start Date End Date Taking? Authorizing Provider  ciprofloxacin (CIPRO) 500 MG tablet Take 1 tablet (500 mg total) by mouth every 12 (twelve) hours for 7 days. 07/09/23 07/16/23 Yes Edwin Dada P, DO  metroNIDAZOLE (FLAGYL) 500 MG tablet Take 1 tablet (500 mg total) by mouth 2 (two) times daily for 7 days. 07/09/23 07/16/23 Yes Edwin Dada P, DO  amoxicillin-clavulanate (AUGMENTIN) 875-125 MG tablet Take 1 tablet by mouth 2 (two) times daily. 07/25/22   Lenn Sink, DPM  diclofenac (VOLTAREN) 75 MG EC tablet Take 1 tablet (75 mg) by mouth 2 (two) times daily. 07/21/22   Lula Olszewski, MD  diclofenac Sodium (VOLTAREN) 1 % GEL Apply 4 grams topically 4 times daily as needed. 07/21/22   Lula Olszewski, MD  doxycycline (VIBRA-TABS) 100 MG tablet Take 1 tablet (100 mg total) by mouth 2 (two) times daily. 08/01/22   Lenn Sink, DPM  famotidine-calcium carbonate-magnesium hydroxide (PEPCID COMPLETE) 10-800-165 MG chewable tablet Chew 1 tablet by mouth daily as needed. 07/15/22   Lula Olszewski, MD  ketorolac (TORADOL) 10 MG tablet Take 1 tablet (10 mg total) by mouth every 6 (six) hours as needed for severe or moderate pain. Do not take with ibuprofen, naproxen, or diclofenac.  Take with food.  May need to take heartburn medications. For severe pain-5 day duration maximum. 07/24/22   Lula Olszewski, MD  methocarbamol (ROBAXIN) 500 MG tablet Take 1 tablet (500 mg total) by mouth every 8 (eight) hours as needed for muscle spasms. 06/11/21   Janeece Agee, NP  ondansetron (ZOFRAN) 4 MG tablet Take 1 tablet (4 mg total) by mouth every 8 (eight) hours as needed for nausea or vomiting. 08/15/22   Lula Olszewski, MD  valACYclovir (VALTREX) 500 MG tablet Take 1 tablet (500 mg total) by mouth daily. 06/11/21   Janeece Agee, NP      Allergies    Other    Review of Systems   Review of Systems  Constitutional:  Negative for chills and fever.  HENT:  Negative for ear pain and sore throat.   Eyes:  Negative for pain and visual disturbance.  Respiratory:  Negative for cough and shortness of breath.   Cardiovascular:  Negative for chest pain and palpitations.  Gastrointestinal:  Negative for abdominal pain and vomiting.  Genitourinary:  Negative for dysuria and hematuria.  Musculoskeletal:  Negative for arthralgias and back pain.  Skin:  Negative for color change and rash.  Neurological:  Negative for seizures and  syncope.  All other systems reviewed and are negative.   Physical Exam Updated Vital Signs BP (!) 124/56 (BP Location: Right Arm)   Pulse 71   Temp 98.7 F (37.1 C)   Resp 16   SpO2 98%  Physical Exam Vitals and nursing note reviewed. Exam conducted with a chaperone present.  Constitutional:      General: He is not in acute distress.    Appearance: He is well-developed.  HENT:     Head: Normocephalic and atraumatic.  Eyes:     Conjunctiva/sclera: Conjunctivae normal.  Cardiovascular:     Rate and Rhythm: Normal rate and regular rhythm.     Heart sounds: No  murmur heard. Pulmonary:     Effort: Pulmonary effort is normal. No respiratory distress.     Breath sounds: Normal breath sounds.  Abdominal:     Palpations: Abdomen is soft.     Tenderness: There is no abdominal tenderness.  Genitourinary:   Musculoskeletal:        General: No swelling.     Cervical back: Neck supple.  Skin:    General: Skin is warm and dry.     Capillary Refill: Capillary refill takes less than 2 seconds.  Neurological:     Mental Status: He is alert.  Psychiatric:        Mood and Affect: Mood normal.     ED Results / Procedures / Treatments   Labs (all labs ordered are listed, but only abnormal results are displayed) Labs Reviewed  CBC WITH DIFFERENTIAL/PLATELET - Abnormal; Notable for the following components:      Result Value   RBC 3.99 (*)    MCH 34.6 (*)    All other components within normal limits  BASIC METABOLIC PANEL WITH GFR - Abnormal; Notable for the following components:   Sodium 150 (*)    Calcium 8.8 (*)    Anion gap 25 (*)    All other components within normal limits    EKG None  Radiology CT ABDOMEN PELVIS WO CONTRAST Result Date: 07/09/2023 CLINICAL DATA:  perianal abscess that spontaneously ruptured, foul purulent drainage, and air. r/o deeper abscess or fistula EXAM: CT ABDOMEN AND PELVIS WITHOUT CONTRAST TECHNIQUE: Multidetector CT imaging of the abdomen and pelvis was performed following the standard protocol without IV contrast. RADIATION DOSE REDUCTION: This exam was performed according to the departmental dose-optimization program which includes automated exposure control, adjustment of the mA and/or kV according to patient size and/or use of iterative reconstruction technique. COMPARISON:  Ultrasound abdomen 09/16/2022 FINDINGS: Lower chest: No acute abnormality. Hepatobiliary: No focal liver abnormality. No gallstones, gallbladder wall thickening, or pericholecystic fluid. No biliary dilatation. Pancreas: No focal lesion.  Normal pancreatic contour. No surrounding inflammatory changes. No main pancreatic ductal dilatation. Spleen: Normal in size without focal abnormality. Adrenals/Urinary Tract: No adrenal nodule bilaterally. No nephrolithiasis and no hydronephrosis. No definite contour-deforming renal mass. No ureterolithiasis or hydroureter. The urinary bladder is unremarkable. Stomach/Bowel: Stomach is within normal limits. No evidence of bowel wall thickening or dilatation. Appendix appears normal. Vascular/Lymphatic: No abdominal aorta or iliac aneurysm. Mild atherosclerotic plaque of the aorta and its branches. No abdominal, pelvic, or inguinal lymphadenopathy. Reproductive: Prostate is unremarkable. Other: No intraperitoneal free fluid. No intraperitoneal free gas. No organized fluid collection. Musculoskeletal: Tiny fat containing umbilical hernia. Mild Subcutaneus soft tissue edema and couple foci of gas along the right medial gluteal cleft extending to the perianal region (2:92). No abscess formation. No suspicious lytic or blastic osseous lesions.  No acute displaced fracture. IMPRESSION: Mild Subcutaneus soft tissue edema and couple foci of gas along the right medial gluteal cleft extending to the perianal region. No drainable fluid collection. Underlying fistula cannot be excluded. No acute intra-abdominal or intrapelvic abnormality with limited evaluation on this noncontrast study. Electronically Signed   By: Tish Frederickson M.D.   On: 07/09/2023 22:17    Procedures Procedures    Medications Ordered in ED Medications  metroNIDAZOLE (FLAGYL) tablet 500 mg (has no administration in time range)  ciprofloxacin (CIPRO) tablet 500 mg (has no administration in time range)  oxyCODONE-acetaminophen (PERCOCET/ROXICET) 5-325 MG per tablet 1 tablet (1 tablet Oral Given 07/09/23 1952)    ED Course/ Medical Decision Making/ A&P                                 Medical Decision Making Amount and/or Complexity of Data  Reviewed Labs: ordered. Radiology: ordered.  Risk Prescription drug management.   61:54 PM 28 year old male presenting for concerns of perianal abscess.  States that he has had 1 in the past several years ago that resolved with antibiotics.  Patient states this current 1 has been ongoing for the past several days.  He has been applying heat and thinks it spontaneously ruptured due to purulent drainage and foul odor.  Coming in for evaluation for further incision and drainage and consideration for antibiotics.  On exam patient is alert and oriented x 3, no acute distress, afebrile, stable vital signs.  No abdominal tenderness.  Patient is nontoxic-appearing.  Physical exam demonstrates 2 x 2 cm region of induration , lateral to the anus, with centralized puncture wound.  Expression the region bubbles with air.  No further purulent drainage.  No bleeding.  Tenderness on exam.  Due to bubbling of air, I'm concerned for some sort of fistula and/or deeper abscess pocket.  CT pelvis ordered.  Basic labs ordered.  Laboratory studies stable.  No signs or symptoms of sepsis.  CT abdomen demonstrates: Mild Subcutaneus soft tissue edema and couple foci of gas along the right medial gluteal cleft extending to the perianal region. No drainable fluid collection. Underlying fistula cannot be excluded.   I spoke with on-call general surgeon Dr. 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  Recommends outpatient follow-up with [facility].  Patient given antibiotics to cover for likely abscess that drained secondary to large expression of purulent drainage spontaneously at the house.  Patient given [facility] surgery contact information with recommendations to call tomorrow for follow-up appointment.  Patient does admit to a history of frequent abdominal discomfort after eating stating " food typically goes right  through me" concerning for possible Crohn's disease.          Final Clinical Impression(s) / ED Diagnoses Final diagnoses:  Anal abscess  Fistula    Rx / DC Orders ED Discharge Orders          Ordered    ciprofloxacin (CIPRO) 500 MG tablet  Every 12 hours        07/09/23 2324    metroNIDAZOLE (FLAGYL) 500 MG tablet  2 times daily        07/09/23 2324              Franne Forts, DO 07/09/23 2327

## 2024-02-18 ENCOUNTER — Other Ambulatory Visit (HOSPITAL_COMMUNITY): Payer: Self-pay

## 2024-02-18 MED ORDER — SUPER PROBIOTIC PO CAPS
1.0000 | ORAL_CAPSULE | Freq: Every day | ORAL | 0 refills | Status: AC
  Filled 2024-02-18 – 2024-02-29 (×3): qty 28, 28d supply, fill #0

## 2024-02-18 MED ORDER — HYDROXYZINE HCL 25 MG PO TABS
25.0000 mg | ORAL_TABLET | Freq: Three times a day (TID) | ORAL | 0 refills | Status: AC | PRN
  Filled 2024-02-18 – 2024-02-29 (×3): qty 30, 10d supply, fill #0

## 2024-02-19 ENCOUNTER — Other Ambulatory Visit (HOSPITAL_COMMUNITY): Payer: Self-pay

## 2024-02-19 MED ORDER — VITAMIN D (ERGOCALCIFEROL) 1.25 MG (50000 UNIT) PO CAPS
50000.0000 [IU] | ORAL_CAPSULE | ORAL | 0 refills | Status: AC
  Filled 2024-02-19 – 2024-02-29 (×2): qty 8, 56d supply, fill #0

## 2024-02-28 ENCOUNTER — Other Ambulatory Visit (HOSPITAL_COMMUNITY): Payer: Self-pay

## 2024-02-29 ENCOUNTER — Other Ambulatory Visit (HOSPITAL_COMMUNITY): Payer: Self-pay

## 2024-02-29 ENCOUNTER — Other Ambulatory Visit: Payer: Self-pay
